# Patient Record
Sex: Female | Born: 1990
Health system: Southern US, Community
[De-identification: ages and names within clinical notes are randomized; demographics above are authoritative.]

## PROBLEM LIST (undated history)

## (undated) ENCOUNTER — Inpatient Hospital Stay (HOSPITAL_COMMUNITY): Payer: Self-pay

## (undated) DIAGNOSIS — E669 Obesity, unspecified: Secondary | ICD-10-CM

## (undated) DIAGNOSIS — Z8759 Personal history of other complications of pregnancy, childbirth and the puerperium: Secondary | ICD-10-CM

## (undated) DIAGNOSIS — K5909 Other constipation: Secondary | ICD-10-CM

## (undated) DIAGNOSIS — I10 Essential (primary) hypertension: Secondary | ICD-10-CM

## (undated) DIAGNOSIS — E282 Polycystic ovarian syndrome: Secondary | ICD-10-CM

## (undated) HISTORY — PX: OTHER SURGICAL HISTORY: SHX169

## (undated) HISTORY — PX: TONSILLECTOMY: SUR1361

## (undated) HISTORY — DX: Other constipation: K59.09

## (undated) HISTORY — DX: Personal history of other complications of pregnancy, childbirth and the puerperium: Z87.59

---
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
## 2009-01-13 ENCOUNTER — Emergency Department (HOSPITAL_COMMUNITY): Admission: EM | Admit: 2009-01-13 | Discharge: 2009-01-14 | Payer: Self-pay | Admitting: Emergency Medicine

## 2010-09-02 NOTE — Op Note (Signed)
[place]. [facility]  Patient:    Colleen Bradley, Colleen Bradley                         MRN: [id number] Proc. Date: 06/14/00 Adm. Date:  [id number] Attending:  Colbert Ewing                           Operative Report  PREOPERATIVE DIAGNOSIS:  Status post open reduction and internal fixation proximal humerus fracture, left, with painful retained hardware.  POSTOPERATIVE DIAGNOSIS:  Status post open reduction and internal fixation proximal humerus fracture, left, with painful retained hardware.  PROCEDURE:  Removal of threaded Steinmann pin, left proximal humerus.  SURGEON:  Loreta Ave, M.D.  ANESTHESIA:  General.  ESTIMATED BLOOD LOSS:  Minimal.  SPECIMENS:  None.  CULTURES:  None.  COMPLICATIONS:  None.  DRESSINGS:  Soft compressive.  DESCRIPTION OF PROCEDURE:  Patient brought to the operating room and after adequate anesthesia had been obtained, left upper arm prepped and draped in the usual sterile fashion.  Previous longitudinal incision utilized for insertion of the pin was opened up.  I could then bring the base of the pin out into this incision.  There was a surrounding bursa with about 10 cc of clear fluid that was drained and evacuated.  The pin was then captured and backed out of the humerus without difficulty.  The wound was then thoroughly irrigated with sterile saline.  Closed with interrupted nylon.  Margins of the wound injected with Marcaine.  A sterile compressive dressing applied. Anesthesia reversed, brought to the recovery room.  Tolerated the surgery well, no complications. DD:  06/14/00 TD:  06/14/00 Job: [id number]

## 2010-09-02 NOTE — Op Note (Signed)
[place]. [facility]  Patient:    Colleen Bradley, Colleen Bradley                         MRN: [id number] Proc. Date: 04/20/00 Adm. Date:  [id number] Attending:  Colbert Ewing                           Operative Report  PREOPERATIVE DIAGNOSIS:  Displaced Salter fracture of proximal humerus, left shoulder.  POSTOPERATIVE DIAGNOSIS:  Displaced Salter fracture of proximal humerus, left shoulder.  PROCEDURE:  Closed reduction and pinning, Salter fracture, proximal humerus left shoulder.  SURGEON:  Loreta Ave, M.D.  ASSISTANT:  Arlys John D. Petrarca, P.A.-C.  ANESTHESIA:  General.  ESTIMATED BLOOD LOSS:  Minimal.  SPECIMENS:  None.  CULTURES:  None.  COMPLICATIONS:  None.  DRESSING:  Soft compressive with shoulder immobilizer.  DESCRIPTION OF PROCEDURE:  Patient brought to the operating room and placed on the operating table in supine position.  After adequate anesthesia had been obtained, placed in a beach chair position on the shoulder positioner, prepped and draped in the usual sterile fashion.  With fluoroscopic guidance with the C-arm, near-anatomic reduction was achieved.  This was then fixed with a partially-threaded guidewire placed through a small stab wound in the lateral aspect of the humerus into the shaft, across the growth plate, and into the humeral head.  Care taken not to penetrate the joint.  One wire was used to stabilize the fracture, and a second one was placed in central position to obtain maximum holding.  The first guidewire was then removed, leaving one in place with good fixation.  The guidewire was cut off below the level of the skin, skin brought over the top of this, and then closed with nylon after being irrigated and injected with Marcaine.  Sterile compressive dressing applied.  Final C-arm films showed anatomic alignment with good fixation. After the immobilizer was in place, the anesthesia was reversed, brought to the  recovery room.  Tolerated the surgery well with no complications. DD:  04/20/00 TD:  04/20/00 Job: [id number]

## 2012-07-10 ENCOUNTER — Emergency Department (HOSPITAL_COMMUNITY)
Admission: EM | Admit: 2012-07-10 | Discharge: 2012-07-10 | Disposition: A | Discharge status: Home / Self Care | Payer: 59 | Attending: Emergency Medicine | Admitting: Emergency Medicine

## 2012-07-10 ENCOUNTER — Encounter (HOSPITAL_COMMUNITY): Payer: Self-pay | Admitting: Adult Health

## 2012-07-10 DIAGNOSIS — E282 Polycystic ovarian syndrome: Secondary | ICD-10-CM | POA: Insufficient documentation

## 2012-07-10 DIAGNOSIS — Z202 Contact with and (suspected) exposure to infections with a predominantly sexual mode of transmission: Secondary | ICD-10-CM | POA: Insufficient documentation

## 2012-07-10 DIAGNOSIS — A599 Trichomoniasis, unspecified: Secondary | ICD-10-CM | POA: Insufficient documentation

## 2012-07-10 DIAGNOSIS — Z2089 Contact with and (suspected) exposure to other communicable diseases: Secondary | ICD-10-CM | POA: Insufficient documentation

## 2012-07-10 DIAGNOSIS — N898 Other specified noninflammatory disorders of vagina: Secondary | ICD-10-CM | POA: Insufficient documentation

## 2012-07-10 DIAGNOSIS — A64 Unspecified sexually transmitted disease: Secondary | ICD-10-CM

## 2012-07-10 DIAGNOSIS — Z3202 Encounter for pregnancy test, result negative: Secondary | ICD-10-CM | POA: Insufficient documentation

## 2012-07-10 DIAGNOSIS — N9489 Other specified conditions associated with female genital organs and menstrual cycle: Secondary | ICD-10-CM | POA: Insufficient documentation

## 2012-07-10 HISTORY — DX: Polycystic ovarian syndrome: E28.2

## 2012-07-10 LAB — WET PREP, GENITAL: Yeast Wet Prep HPF POC: NONE SEEN

## 2012-07-10 LAB — POCT PREGNANCY, URINE: Preg Test, Ur: NEGATIVE

## 2012-07-10 MED ORDER — METRONIDAZOLE 500 MG PO TABS
2000.0000 mg | ORAL_TABLET | Freq: Once | ORAL | Status: AC
  Administered 2012-07-10: 2000 mg via ORAL
  Filled 2012-07-10: qty 4

## 2012-07-10 MED ORDER — LIDOCAINE HCL (PF) 1 % IJ SOLN
INTRAMUSCULAR | Status: AC
  Administered 2012-07-10: 2 mL
  Filled 2012-07-10: qty 5

## 2012-07-10 MED ORDER — CEFTRIAXONE SODIUM 250 MG IJ SOLR
250.0000 mg | Freq: Once | INTRAMUSCULAR | Status: AC
  Administered 2012-07-10: 250 mg via INTRAMUSCULAR
  Filled 2012-07-10: qty 250

## 2012-07-10 MED ORDER — AZITHROMYCIN 1 G PO PACK
1.0000 g | PACK | Freq: Once | ORAL | Status: AC
  Administered 2012-07-10: 1 g via ORAL
  Filled 2012-07-10: qty 1

## 2012-07-10 NOTE — ED Provider Notes (Signed)
History    This chart was scribed for non-physician practitioner Dierdre Forth, PA-C working with Joya Gaskins, MD by Gerlean Ren, ED Scribe. This patient was seen in room TR10C/TR10C and the patient's care was started at 6:20 PM.    CSN: [id number]  Arrival date & time 07/10/12  1712   First MD Initiated Contact with Patient 07/10/12 1742      Chief Complaint  Patient presents with  . Vaginal Discharge     The history is provided by the patient. No language interpreter was used.  Colleen Bradley is a 22 y.o. female who presents to the Emergency Department complaining of 4-6 weeks of clear vaginal discharge that waxes-and-wanes in amount of discharge and occasional itchiness.  Pt denies any associated vaginal pain or abdominal pain.  No recent antibiotics.  Last sexual intercourse 2 months ago, prior to any of the current symptoms.  Pt reports that her last sexual partner was treated for trichomonas prior to last sexual intercourse 2 months ago.  She has never been treated for trichomonas. No pain or blood during intercourse.  No birth control.  LNMP 02/25, pt states she feels that her menstrual cycle is about to begin.  Pt has PCOS.  No current regular medications.  Past Medical History  Diagnosis Date  . PCOS (polycystic ovarian syndrome)     History reviewed. No pertinent past surgical history.  History reviewed. No pertinent family history.  History  Substance Use Topics  . Smoking status: Not on file  . Smokeless tobacco: Not on file  . Alcohol Use: Not on file    No OB history provided.   Review of Systems  Constitutional: Negative for fever, diaphoresis, appetite change, fatigue and unexpected weight change.  HENT: Negative for mouth sores and neck stiffness.   Eyes: Negative for visual disturbance.  Respiratory: Negative for cough, chest tightness, shortness of breath and wheezing.   Cardiovascular: Negative for chest pain.  Gastrointestinal: Negative for  nausea, vomiting, abdominal pain, diarrhea and constipation.  Endocrine: Negative for polydipsia, polyphagia and polyuria.  Genitourinary: Positive for vaginal discharge. Negative for dysuria, urgency, frequency, hematuria, vaginal bleeding, vaginal pain and pelvic pain.       Vaginal itching  Musculoskeletal: Negative for back pain.  Skin: Negative for rash.  Allergic/Immunologic: Negative for immunocompromised state.  Neurological: Negative for syncope, light-headedness and headaches.  Hematological: Does not bruise/bleed easily.  Psychiatric/Behavioral: Negative for sleep disturbance. The patient is not nervous/anxious.     Allergies  Penicillins  Home Medications  No current outpatient prescriptions on file.  BP 157/99  Pulse 96  Temp(Src) 98.4 F (36.9 C) (Oral)  Resp 16  SpO2 100%  LMP 06/14/2012  Physical Exam  Nursing note and vitals reviewed. Constitutional: She is oriented to person, place, and time. She appears well-developed and well-nourished. No distress.  HENT:  Head: Normocephalic and atraumatic.  Mouth/Throat: Oropharynx is clear and moist. No oropharyngeal exudate.  Eyes: Conjunctivae and EOM are normal. Pupils are equal, round, and reactive to light. No scleral icterus.  Neck: Normal range of motion. Neck supple.  Cardiovascular: Normal rate, regular rhythm, normal heart sounds and intact distal pulses.  Exam reveals no gallop and no friction rub.   No murmur heard. Pulmonary/Chest: Effort normal and breath sounds normal. No respiratory distress. She has no wheezes.  Abdominal: Soft. Bowel sounds are normal. She exhibits no mass. There is no tenderness. There is no rebound and no guarding. Hernia confirmed negative in the right  inguinal area and confirmed negative in the left inguinal area.  Genitourinary: No labial fusion. There is no rash, tenderness, lesion or injury on the right labia. There is no rash, tenderness, lesion or injury on the left labia.  Uterus is not deviated, not enlarged, not fixed and not tender. Cervix exhibits discharge (thick milky white). Cervix exhibits no motion tenderness and no friability. Right adnexum displays no mass, no tenderness and no fullness. Left adnexum displays no mass, no tenderness and no fullness. No erythema, tenderness or bleeding around the vagina. No foreign body around the vagina. No signs of injury around the vagina. Vaginal discharge (thick, moderate amount, milky white) found.  Musculoskeletal: Normal range of motion. She exhibits no edema.  Lymphadenopathy:    She has no cervical adenopathy.       Right: No inguinal adenopathy present.       Left: No inguinal adenopathy present.  Neurological: She is alert and oriented to person, place, and time. She exhibits normal muscle tone. Coordination normal.  Speech is clear and goal oriented Moves extremities without ataxia  Skin: Skin is warm and dry. No rash noted. She is not diaphoretic. No erythema.  Psychiatric: She has a normal mood and affect.    ED Course  Procedures (including critical care time) DIAGNOSTIC STUDIES: Oxygen Saturation is 100% on room air, normal by my interpretation.    COORDINATION OF CARE: 6:24 PM- Patient informed of clinical course including pelvic exam, understands medical decision-making process, and agrees with plan.  Informed pt that her last sexual partner may need to be treated for trichomonas again due to intercourse after last treatment.  Results for orders placed during the hospital encounter of 07/10/12  WET PREP, GENITAL      Result Value Range   Yeast Wet Prep HPF POC NONE SEEN  NONE SEEN   Trich, Wet Prep MODERATE (*) NONE SEEN   Clue Cells Wet Prep HPF POC NONE SEEN  NONE SEEN   WBC, Wet Prep HPF POC TOO NUMEROUS TO COUNT (*) NONE SEEN  POCT PREGNANCY, URINE      Result Value Range   Preg Test, Ur NEGATIVE  NEGATIVE       1. Trichomonas   2. Trichomonas contact   3. STD (female)   4. Vaginal  discharge   5. POLYCYSTIC OVARY       MDM  SEINI LANNOM presents with exposure to any concerns for STDs associated with vaginal discharge.  Patient to be discharged with instructions to follow up with OBGYN. Discussed importance of using protection when sexually active. Pt understands that they have GC/Chlamydia cultures pending and that they will need to inform all sexual partners if results return positive. Pt has been treated prophylacticly with azithromycin and rocephin due to pts history, pelvic exam, and wet prep with increased WBCs. Patient also with trichomonas on wet prep and was treated here in the department with Flagyl. Pt not concerning for PID because hemodynamically stable and no cervical motion tenderness on pelvic exam. Without evidence of BV or yeast. I have also discussed reasons to return immediately to the ER.  Patient expresses understanding and agrees with plan.  Patient with an allergy to penicillin therefore she was observed for greater than 30 minutes after Rocephin IM. Patient had no adverse reactions and did not develop shortness of breath, wheezing, hives or other complication.   I personally performed the services described in this documentation, which was scribed in my presence. The recorded information  has been reviewed and is accurate.       Dahlia Client Marlyss Cissell, PA-C 07/10/12 2036

## 2012-07-10 NOTE — ED Notes (Signed)
Presents with clear/white vaginal discharge and itchiness that began one month ago. Recent sexual contact with a person with Trichomonas. Denies abdominal pain.

## 2012-07-11 LAB — GC/CHLAMYDIA PROBE AMP
CT Probe RNA: NEGATIVE
GC Probe RNA: NEGATIVE

## 2012-07-11 NOTE — ED Provider Notes (Signed)
Medical screening examination/treatment/procedure(s) were performed by non-physician practitioner and as supervising physician I was immediately available for consultation/collaboration.   Joya Gaskins, MD 07/11/12 [phone]

## 2012-09-08 ENCOUNTER — Encounter (HOSPITAL_COMMUNITY): Payer: Self-pay | Admitting: Nurse Practitioner

## 2012-09-08 ENCOUNTER — Emergency Department (HOSPITAL_COMMUNITY)
Admission: EM | Admit: 2012-09-08 | Discharge: 2012-09-08 | Disposition: A | Discharge status: Home / Self Care | Payer: 59 | Attending: Emergency Medicine | Admitting: Emergency Medicine

## 2012-09-08 DIAGNOSIS — F17200 Nicotine dependence, unspecified, uncomplicated: Secondary | ICD-10-CM | POA: Insufficient documentation

## 2012-09-08 DIAGNOSIS — E282 Polycystic ovarian syndrome: Secondary | ICD-10-CM | POA: Insufficient documentation

## 2012-09-08 DIAGNOSIS — Z880 Allergy status to penicillin: Secondary | ICD-10-CM | POA: Insufficient documentation

## 2012-09-08 DIAGNOSIS — N898 Other specified noninflammatory disorders of vagina: Secondary | ICD-10-CM | POA: Insufficient documentation

## 2012-09-08 DIAGNOSIS — R102 Pelvic and perineal pain: Secondary | ICD-10-CM

## 2012-09-08 DIAGNOSIS — Z3202 Encounter for pregnancy test, result negative: Secondary | ICD-10-CM | POA: Insufficient documentation

## 2012-09-08 DIAGNOSIS — Z8619 Personal history of other infectious and parasitic diseases: Secondary | ICD-10-CM | POA: Insufficient documentation

## 2012-09-08 DIAGNOSIS — E669 Obesity, unspecified: Secondary | ICD-10-CM | POA: Insufficient documentation

## 2012-09-08 DIAGNOSIS — N949 Unspecified condition associated with female genital organs and menstrual cycle: Secondary | ICD-10-CM | POA: Insufficient documentation

## 2012-09-08 LAB — URINE MICROSCOPIC-ADD ON

## 2012-09-08 LAB — URINALYSIS, ROUTINE W REFLEX MICROSCOPIC
Bilirubin Urine: NEGATIVE
Ketones, ur: NEGATIVE mg/dL
Nitrite: NEGATIVE
Specific Gravity, Urine: 1.023 (ref 1.005–1.030)
pH: 7 (ref 5.0–8.0)

## 2012-09-08 LAB — COMPREHENSIVE METABOLIC PANEL
ALT: 9 U/L (ref 0–35)
AST: 12 U/L (ref 0–37)
Alkaline Phosphatase: 55 U/L (ref 39–117)
CO2: 23 mEq/L (ref 19–32)
GFR calc Af Amer: >90 mL/min (ref >90)
GFR calc non Af Amer: >90 mL/min (ref >90)
Glucose, Bld: 95 mg/dL (ref 70–99)
Potassium: 3.7 mEq/L (ref 3.5–5.1)
Sodium: 137 mEq/L (ref 135–145)
Total Protein: 7.3 g/dL (ref 6.0–8.3)

## 2012-09-08 LAB — CBC WITH DIFFERENTIAL/PLATELET
Basophils Absolute: 0 10*3/uL (ref 0.0–0.1)
Lymphocytes Relative: 46 % (ref 12–46)
Lymphs Abs: 3.6 10*3/uL (ref 0.7–4.0)
Neutrophils Relative %: 45 % (ref 43–77)
Platelets: 329 10*3/uL (ref 150–400)
RBC: 4.61 MIL/uL (ref 3.87–5.11)
RDW: 12.6 % (ref 11.5–15.5)
WBC: 8 10*3/uL (ref 4.0–10.5)

## 2012-09-08 LAB — WET PREP, GENITAL
Trich, Wet Prep: NONE SEEN
Yeast Wet Prep HPF POC: NONE SEEN

## 2012-09-08 MED ORDER — LIDOCAINE HCL (PF) 1 % IJ SOLN
INTRAMUSCULAR | Status: AC
  Filled 2012-09-08: qty 5

## 2012-09-08 MED ORDER — CEFTRIAXONE SODIUM 1 G IJ SOLR
1.0000 g | Freq: Once | INTRAMUSCULAR | Status: AC
  Administered 2012-09-08: 1 g via INTRAMUSCULAR
  Filled 2012-09-08: qty 10

## 2012-09-08 MED ORDER — LIDOCAINE HCL (PF) 1 % IJ SOLN
1.0000 mL | Freq: Once | INTRAMUSCULAR | Status: AC
  Administered 2012-09-08: 1 mL

## 2012-09-08 MED ORDER — AZITHROMYCIN 1 G PO PACK
1.0000 g | PACK | Freq: Once | ORAL | Status: AC
  Administered 2012-09-08: 1 g via ORAL
  Filled 2012-09-08: qty 1
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
## 2012-09-08 NOTE — ED Provider Notes (Signed)
History     CSN: [id number]  Arrival date & time 09/08/12  1253   First MD Initiated Contact with Patient 09/08/12 1313      Chief Complaint  Patient presents with  . Abdominal Pain    (Consider location/radiation/quality/duration/timing/severity/associated sxs/prior treatment) HPI Comments: Colleen Bradley is a 22 y/o F with PMHx of PCOS and Trichomoniasis infection presenting to the ED with pelvic pain x 2 days. Patient stated that lower pelvic pain is a constant pressure sensation with intermittent episodes of cramping that last a couple of minutes with radiation to the back as an intermittent aching sensation. Patient reported that when she was douching approximately 2 days ago she noticed an incident of green discharge that was stringy consistency - patient stated that she only saw this form of discharge once and only at this moment. Patient stated that she had one episodes of emesis 2 days ago - consisting mainly of food contents. Patient reported that she had a headache the other day - bilateral aching sensation - that has resolved. LMP 08/15/2012, patient reported being sexually active and last sexual encounter was approximate 4 days ago - reported that she does not use protection. Denied fever, chills, nausea, diarrhea, hematuria, dysuria, numbness, tingling, rash, visual distortions.   PCP none  The history is provided by the patient. No language interpreter was used.    Past Medical History  Diagnosis Date  . PCOS (polycystic ovarian syndrome)     No past surgical history on file.  No family history on file.  History  Substance Use Topics  . Smoking status: Current Every Day Smoker    Types: Cigarettes  . Smokeless tobacco: Not on file  . Alcohol Use: No    OB History   Grav Para Term Preterm Abortions TAB SAB Ect Mult Living                  Review of Systems  Constitutional: Negative for fever, chills and fatigue.  HENT: Negative for trouble swallowing, neck pain  and neck stiffness.   Eyes: Negative for photophobia, pain and visual disturbance.  Respiratory: Negative for cough, chest tightness and shortness of breath.   Gastrointestinal: Positive for vomiting. Negative for nausea, abdominal pain, diarrhea, constipation and blood in stool.  Genitourinary: Positive for vaginal discharge and pelvic pain. Negative for dysuria, frequency, decreased urine volume, vaginal bleeding, difficulty urinating and vaginal pain.  Musculoskeletal: Positive for back pain.  Skin: Negative for rash.  Neurological: Positive for headaches. Negative for dizziness, weakness, light-headedness and numbness.  All other systems reviewed and are negative.    Allergies  Penicillins  Home Medications  No current outpatient prescriptions on file.  BP 149/90  Pulse 93  Temp(Src) 98.4 F (36.9 C) (Oral)  Resp 20  SpO2 96%  LMP 08/15/2012  Physical Exam  Nursing note and vitals reviewed. Constitutional: She is oriented to person, place, and time. She appears well-developed and well-nourished. No distress.  Patient calm  Appears to be relaxed, in no acute pain or discomfort.   HENT:  Head: Normocephalic and atraumatic.  Mouth/Throat: Oropharynx is clear and moist. No oropharyngeal exudate.  Uvula midline, symmetrical elevation   Eyes: Conjunctivae and EOM are normal. Pupils are equal, round, and reactive to light. Right eye exhibits no discharge. Left eye exhibits no discharge.  Neck: Normal range of motion. Neck supple. No tracheal deviation present. No thyromegaly present.  Negative neck stiffness Negative nuchal rigidity Negative lymphadenopathy  Cardiovascular: Normal rate, regular  rhythm and normal heart sounds.  Exam reveals no friction rub.   No murmur heard. Pulses:      Radial pulses are 2+ on the right side, and 2+ on the left side.  Pulmonary/Chest: Effort normal and breath sounds normal. No respiratory distress. She has no wheezes. She has no rales.    Abdominal: Soft. Bowel sounds are normal. She exhibits no distension, no fluid wave, no ascites and no mass. There is tenderness (Pelvic pain bilaterally). There is no rigidity, no rebound and no guarding.    Obese   Genitourinary: Vaginal discharge found.  Speculum: Negative lesions, sores, erythema, swelling, inflammation noted to the external genitalia. Negative swelling, erythema, inflammation, lesions, sores noted to the vaginal canal. Positive thick white discharge noted. Non-odorous. Negative erythema, inflammation, swelling, "strawberry" appearance to the cervix.   Pelvic: Negative masses, lesions, sores palpated. Negative cervical motion tenderness. Adenexal  tenderness noted to the left side upon palpation - mild discomfort.   Lymphadenopathy:    She has no cervical adenopathy.  Neurological: She is alert and oriented to person, place, and time. No cranial nerve deficit. She exhibits normal muscle tone. Coordination normal.  Skin: Skin is warm and dry. No rash noted. She is not diaphoretic. No erythema.  Psychiatric: She has a normal mood and affect. Her behavior is normal. Thought content normal.    ED Course  Procedures (including critical care time)  Labs Reviewed  COMPREHENSIVE METABOLIC PANEL - Abnormal; Notable for the following:    Total Bilirubin 0.2 (*)    All other components within normal limits  URINALYSIS, ROUTINE W REFLEX MICROSCOPIC - Abnormal; Notable for the following:    Hgb urine dipstick SMALL (*)    Protein, ur 30 (*)    All other components within normal limits  URINE MICROSCOPIC-ADD ON - Abnormal; Notable for the following:    Squamous Epithelial / LPF FEW (*)    Bacteria, UA FEW (*)    All other components within normal limits  WET PREP, GENITAL  GC/CHLAMYDIA PROBE AMP  CBC WITH DIFFERENTIAL  POCT PREGNANCY, URINE   No results found.   1. Pelvic pain   2. Vaginal discharge   3. History of trichomoniasis   4. Polycystic ovarian syndrome        MDM  Patient afebrile, non-tachycardic, non-tachpneic, normotensive, adequate saturation on room air, alert and oriented.  Patient recently treated for Trich 06/2012  Wet prep negative findings GC/Chlamydia probe being processed  CBC negative findings CMP negative findings UA negative findings Urine pregnancy negative  Patient appears calm and collected. Negative acute abdomen and peritoneal findings. Upon pelvic exam, mild adenexal tenderness noted to the left side. Thick, white discharge noted to the pelvic exam - patient tolerated exam well. Negative infections noted on swabs. Imaging not warranted at the moment secondary to PCOS history - less likely to be ovarian torsion since patient comfortable and not experiencing discomfort during time in ED setting. Patient given Ceftriaxone 1 gram IM and Azithromycin 1 gram PO given for coverage of possible infection. Patient aseptic, non-toxic appearing, in no acute distress. Patient discharged. Referred patient to [facility] and referred patient to [facility]. Discussed with patient the need to continue to receive Pap smears at least once a year. Discussed with patient to continue to rest and stay hydrated. Educated patient on STDs and the importance of sexual protection. Discussed with patient to monitor symptoms and if symptoms are to worsen or change to report back to the  ED. Resource guide given to patient.  Patient agreed to plan of care, understood, all questions answered.        Raymon Mutton, PA-C 09/08/12 1726

## 2012-09-08 NOTE — ED Notes (Signed)
Pt to be discharged home. Has no further questions. Instructed to refrain from sexual activity until tests result.

## 2012-09-08 NOTE — ED Notes (Signed)
Pt states lower abdominal pain, green colored vaginal discharge and nausea/vomiting. Ongoing for several days. 6/10 pain at the time. LMP: 08/15/12. Pt is alert and oriented x4.

## 2012-09-09 NOTE — ED Provider Notes (Signed)
Medical screening examination/treatment/procedure(s) were performed by non-physician practitioner and as supervising physician I was immediately available for consultation/collaboration.   Loren Racer, MD 09/09/12 [phone]

## 2012-09-10 ENCOUNTER — Inpatient Hospital Stay (HOSPITAL_COMMUNITY)
Admission: AD | Admit: 2012-09-10 | Discharge: 2012-09-10 | Discharge status: Against Medical Advice | Payer: Self-pay | Source: Ambulatory Visit | Attending: Obstetrics & Gynecology | Admitting: Obstetrics & Gynecology

## 2012-09-10 ENCOUNTER — Encounter (HOSPITAL_COMMUNITY): Payer: Self-pay | Admitting: *Deleted

## 2012-09-10 LAB — GC/CHLAMYDIA PROBE AMP: CT Probe RNA: NEGATIVE

## 2012-09-10 NOTE — MAU Note (Signed)
Not in lobby #2 

## 2012-09-10 NOTE — MAU Note (Signed)
Not in lobby

## 2012-09-10 NOTE — MAU Note (Signed)
Not in lobby #3.  Not preg, ? Was to f/u in hosp CLIN[facility] per note, not MAU

## 2012-09-10 NOTE — MAU Note (Deleted)
Has been in distress, mom had surgery again- causing stress.  Has had cramping in lower abd.  Just wanted to get checked- make sure everything is ok

## 2013-03-30 ENCOUNTER — Emergency Department (HOSPITAL_COMMUNITY)
Admission: EM | Admit: 2013-03-30 | Discharge: 2013-03-31 | Disposition: A | Discharge status: Home / Self Care | Payer: 59 | Attending: Emergency Medicine | Admitting: Emergency Medicine

## 2013-03-30 ENCOUNTER — Encounter (HOSPITAL_COMMUNITY): Payer: Self-pay | Admitting: Emergency Medicine

## 2013-03-30 DIAGNOSIS — S139XXA Sprain of joints and ligaments of unspecified parts of neck, initial encounter: Secondary | ICD-10-CM | POA: Insufficient documentation

## 2013-03-30 DIAGNOSIS — S339XXA Sprain of unspecified parts of lumbar spine and pelvis, initial encounter: Secondary | ICD-10-CM | POA: Insufficient documentation

## 2013-03-30 DIAGNOSIS — Z8742 Personal history of other diseases of the female genital tract: Secondary | ICD-10-CM | POA: Insufficient documentation

## 2013-03-30 DIAGNOSIS — S39012A Strain of muscle, fascia and tendon of lower back, initial encounter: Secondary | ICD-10-CM

## 2013-03-30 DIAGNOSIS — Y9389 Activity, other specified: Secondary | ICD-10-CM | POA: Insufficient documentation

## 2013-03-30 DIAGNOSIS — Y92410 Unspecified street and highway as the place of occurrence of the external cause: Secondary | ICD-10-CM | POA: Insufficient documentation

## 2013-03-30 DIAGNOSIS — Z880 Allergy status to penicillin: Secondary | ICD-10-CM | POA: Insufficient documentation

## 2013-03-30 DIAGNOSIS — S161XXA Strain of muscle, fascia and tendon at neck level, initial encounter: Secondary | ICD-10-CM

## 2013-03-30 DIAGNOSIS — S0990XA Unspecified injury of head, initial encounter: Secondary | ICD-10-CM | POA: Insufficient documentation

## 2013-03-30 DIAGNOSIS — F17200 Nicotine dependence, unspecified, uncomplicated: Secondary | ICD-10-CM | POA: Insufficient documentation

## 2013-03-30 MED ORDER — CYCLOBENZAPRINE HCL 10 MG PO TABS
5.0000 mg | ORAL_TABLET | Freq: Once | ORAL | Status: AC
  Administered 2013-03-31: 5 mg via ORAL
  Filled 2013-03-30: qty 1

## 2013-03-30 MED ORDER — KETOROLAC TROMETHAMINE 30 MG/ML IJ SOLN
30.0000 mg | Freq: Once | INTRAMUSCULAR | Status: AC
  Administered 2013-03-31: 30 mg via INTRAMUSCULAR
  Filled 2013-03-30: qty 1
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
## 2013-03-31 MED ORDER — CYCLOBENZAPRINE HCL 5 MG PO TABS: 5.0000 mg | ORAL_TABLET | Freq: Three times a day (TID) | ORAL | Status: DC | PRN

## 2013-03-31 MED ORDER — NAPROXEN 375 MG PO TABS: 375.0000 mg | ORAL_TABLET | Freq: Two times a day (BID) | ORAL | Status: DC

## 2013-03-31 NOTE — ED Provider Notes (Signed)
Medical screening examination/treatment/procedure(s) were performed by non-physician practitioner and as supervising physician I was immediately available for consultation/collaboration.  EKG Interpretation   None         Oberon Hehir, MD 03/31/13 0505 

## 2013-06-05 ENCOUNTER — Encounter (HOSPITAL_COMMUNITY): Payer: Self-pay | Admitting: Emergency Medicine

## 2013-06-05 DIAGNOSIS — Z3202 Encounter for pregnancy test, result negative: Secondary | ICD-10-CM | POA: Insufficient documentation

## 2013-06-05 DIAGNOSIS — E669 Obesity, unspecified: Secondary | ICD-10-CM | POA: Insufficient documentation

## 2013-06-05 DIAGNOSIS — Z880 Allergy status to penicillin: Secondary | ICD-10-CM | POA: Insufficient documentation

## 2013-06-05 DIAGNOSIS — I10 Essential (primary) hypertension: Secondary | ICD-10-CM | POA: Insufficient documentation

## 2013-06-05 DIAGNOSIS — F17200 Nicotine dependence, unspecified, uncomplicated: Secondary | ICD-10-CM | POA: Insufficient documentation

## 2013-06-05 DIAGNOSIS — A5901 Trichomonal vulvovaginitis: Secondary | ICD-10-CM | POA: Insufficient documentation

## 2013-06-05 DIAGNOSIS — A64 Unspecified sexually transmitted disease: Secondary | ICD-10-CM | POA: Insufficient documentation

## 2013-06-05 LAB — URINALYSIS, ROUTINE W REFLEX MICROSCOPIC
Bilirubin Urine: NEGATIVE
Glucose, UA: NEGATIVE mg/dL
KETONES UR: NEGATIVE mg/dL
NITRITE: NEGATIVE
PROTEIN: 30 mg/dL — AB
Specific Gravity, Urine: 1.026 (ref 1.005–1.030)
UROBILINOGEN UA: 1 mg/dL (ref 0.0–1.0)
pH: 7 (ref 5.0–8.0)

## 2013-06-05 LAB — URINE MICROSCOPIC-ADD ON

## 2013-06-05 LAB — POC URINE PREG, ED: Preg Test, Ur: NEGATIVE

## 2013-06-05 NOTE — ED Notes (Signed)
Pt. reports malodorous vaginal discharge x 1 month with occasional mild lower abdominal cramping . Denies dysuria or fever .

## 2013-06-06 ENCOUNTER — Emergency Department (HOSPITAL_COMMUNITY)
Admission: EM | Admit: 2013-06-06 | Discharge: 2013-06-06 | Disposition: A | Discharge status: Home / Self Care | Payer: No Typology Code available for payment source | Attending: Emergency Medicine | Admitting: Emergency Medicine

## 2013-06-06 DIAGNOSIS — A64 Unspecified sexually transmitted disease: Secondary | ICD-10-CM

## 2013-06-06 DIAGNOSIS — A599 Trichomoniasis, unspecified: Secondary | ICD-10-CM

## 2013-06-06 HISTORY — DX: Essential (primary) hypertension: I10

## 2013-06-06 HISTORY — DX: Obesity, unspecified: E66.9

## 2013-06-06 LAB — GC/CHLAMYDIA PROBE AMP
CT Probe RNA: NEGATIVE
GC Probe RNA: NEGATIVE

## 2013-06-06 LAB — WET PREP, GENITAL: Yeast Wet Prep HPF POC: NONE SEEN

## 2013-06-06 MED ORDER — METRONIDAZOLE 500 MG PO TABS: 500.0000 mg | ORAL_TABLET | Freq: Two times a day (BID) | ORAL | Status: DC

## 2013-06-06 MED ORDER — AZITHROMYCIN 250 MG PO TABS
1000.0000 mg | ORAL_TABLET | Freq: Once | ORAL | Status: AC
Start: 2013-06-06 — End: 2013-06-06
  Administered 2013-06-06: 1000 mg via ORAL
  Filled 2013-06-06: qty 4

## 2013-06-06 MED ORDER — METRONIDAZOLE 500 MG PO TABS
2000.0000 mg | ORAL_TABLET | Freq: Once | ORAL | Status: AC
  Administered 2013-06-06: 2000 mg via ORAL
  Filled 2013-06-06: qty 4

## 2013-06-06 MED ORDER — CEFTRIAXONE SODIUM 250 MG IJ SOLR
250.0000 mg | Freq: Once | INTRAMUSCULAR | Status: AC
  Administered 2013-06-06: 250 mg via INTRAMUSCULAR
  Filled 2013-06-06: qty 250

## 2013-06-06 NOTE — ED Provider Notes (Signed)
CSN: [id number]     Arrival date & time 06/05/13  2139 History   First MD Initiated Contact with Patient 06/06/13 0119     Chief Complaint  Patient presents with  . Vaginal Discharge   HPI  3 provided by the patient. The patient is a 23 year old female with past history of hypertension, PCO S. and obesity who presents with symptoms of continued or worsened vaginal discharge. Patient states she has had vaginal discharge which is thick, white and malodorous for the past several weeks to one month. She does report past history of trichomonas STD. She states symptoms are somewhat similar. She essentially active with one partner. In does not use condoms regularly. She denies any vaginal bleeding. Denies any menstrual change but does have irregular menstruation. Denies any significant abdominal pain. No fever, chills or sweats. No nausea or vomiting. No other aggravating or alleviating symptoms. no other associated symptoms.   Past Medical History  Diagnosis Date  . PCOS (polycystic ovarian syndrome)   . Obesity   . Hypertension    History reviewed. No pertinent past surgical history. No family history on file. History  Substance Use Topics  . Smoking status: Current Every Day Smoker    Types: Cigarettes  . Smokeless tobacco: Not on file  . Alcohol Use: No   OB History   Grav Para Term Preterm Abortions TAB SAB Ect Mult Living   1              Review of Systems  Constitutional: Negative for fever, chills and diaphoresis.  Gastrointestinal: Negative for nausea, vomiting, abdominal pain and diarrhea.  Genitourinary: Positive for vaginal discharge. Negative for dysuria, frequency, hematuria, flank pain and vaginal bleeding.  All other systems reviewed and are negative.      Allergies  Penicillins  Home Medications  No current outpatient prescriptions on file. BP 139/79  Pulse 98  Temp(Src) 97.9 F (36.6 C) (Oral)  Resp 14  Ht 5\' 7"  (1.702 m)  Wt 288 lb (130.636 kg)  BMI  45.10 kg/m2  SpO2 100%  LMP 05/01/2013 Physical Exam  Nursing note and vitals reviewed. Constitutional: She is oriented to person, place, and time. She appears well-developed and well-nourished. No distress.  HENT:  Head: Normocephalic.  Cardiovascular: Normal rate and regular rhythm.   Pulmonary/Chest: Effort normal and breath sounds normal.  Abdominal: Soft. There is no tenderness. There is no rebound and no guarding.  Obese  Genitourinary:  Chaperone was present. There was large amounts of thick white vaginal discharge. No CMT. No adnexal pain. No masses. No bleeding.  Neurological: She is alert and oriented to person, place, and time.  Skin: Skin is warm and dry. No rash noted.  Psychiatric: She has a normal mood and affect. Her behavior is normal.    ED Course  Procedures    COORDINATION OF CARE:  Nursing notes reviewed. Vital signs reviewed. Initial pt interview and examination performed.   1:40 AM-patient seen and evaluated. Patient well appearing. Patient with trichomonas on UA. Discussed finding and treatment. Patient advised to inform all sexual partners. [facility] She agrees to plan.   Treatment plan initiated: Medications  cefTRIAXone (ROCEPHIN) injection 250 mg (not administered)  azithromycin (ZITHROMAX) tablet 1,000 mg (not administered)  metroNIDAZOLE (FLAGYL) tablet 2,000 mg (not administered)       MDM   Final diagnoses:  Trichomoniasis  STD (female)      Angus Sellereter S Shewanda Sharpe, PA-C 06/06/13  0151 

## 2013-06-06 NOTE — ED Provider Notes (Signed)
Medical screening examination/treatment/procedure(s) were performed by non-physician practitioner and as supervising physician I was immediately available for consultation/collaboration.    Colleen Mackielga M Briant Angelillo, MD 06/06/13 [phone]

## 2013-06-06 NOTE — Discharge Instructions (Signed)
You were found to have a STD.  Please notify all of your sexual partners so they may also be treated.  Follow up with a primary care provider or county health department.  Use condoms to prevent spread of STDs.    Sexually Transmitted Disease A sexually transmitted disease (STD) is a disease or infection that may be passed (transmitted) from person to person, usually during sexual activity. This may happen by way of saliva, semen, blood, vaginal mucus, or urine. Common STDs include:   Gonorrhea.   Chlamydia.   Syphilis.   HIV and AIDS.   Genital herpes.   Hepatitis B and C.   Trichomonas.   Human papillomavirus (HPV).   Pubic lice.   Scabies.  Mites.  Bacterial vaginosis. WHAT ARE CAUSES OF STDs? An STD may be caused by bacteria, a virus, or parasites. STDs are often transmitted during sexual activity if one person is infected. However, they may also be transmitted through nonsexual means. STDs may be transmitted after:   Sexual intercourse with an infected person.   Sharing sex toys with an infected person.   Sharing needles with an infected person or using unclean piercing or tattoo needles.  Having intimate contact with the genitals, mouth, or rectal areas of an infected person.   Exposure to infected fluids during birth. WHAT ARE THE SIGNS AND SYMPTOMS OF STDs? Different STDs have different symptoms. Some people may not have any symptoms. If symptoms are present, they may include:   Painful or bloody urination.   Pain in the pelvis, abdomen, vagina, anus, throat, or eyes.   Skin rash, itching, irritation, growths, sores (lesions), ulcerations, or warts in the genital or anal area.  Abnormal vaginal discharge with or without bad odor.   Penile discharge in men.   Fever.   Pain or bleeding during sexual intercourse.   Swollen glands in the groin area.   Yellow skin and eyes (jaundice). This is seen with hepatitis.   Swollen  testicles.  Infertility.  Sores and blisters in the mouth. HOW ARE STDs DIAGNOSED? To make a diagnosis, your health care provider may:   Take a medical history.   Perform a physical exam.   Take a sample of any discharge for examination.  Swab the throat, cervix, opening to the penis, rectum, or vagina for testing.  Test a sample of your first morning urine.   Perform blood tests.   Perform a Pap smear, if this applies.   Perform a colposcopy.   Perform a laparoscopy.  HOW ARE STDs TREATED? Treatment depends on the STD. Some STDs may be treated but not cured.   Chlamydia, gonorrhea, trichomonas, and syphilis can be cured with antibiotics.   Genital herpes, hepatitis, and HIV can be treated, but not cured, with prescribed medicines. The medicines lessen symptoms.   Genital warts from HPV can be treated with medicine or by freezing, burning (electrocautery), or surgery. Warts may come back.   HPV cannot be cured with medicine or surgery. However, abnormal areas may be removed from the cervix, vagina, or vulva.   If your diagnosis is confirmed, your recent sexual partners need treatment. This is true even if they are symptom-free or have a negative culture or evaluation. They should not have sex until their health care providers say it is OK. HOW CAN I REDUCE MY RISK OF GETTING AN STD?  Use latex condoms, dental dams, and water-soluble lubricants during sexual activity. Do not use petroleum jelly or oils.  Get vaccinated  for HPV and hepatitis. If you have not received these vaccines in the past, talk to your health care provider about whether one or both might be right for you.   Avoid risky sex practices that can break the skin.  WHAT SHOULD I DO IF I THINK I HAVE AN STD?  See your health care provider.   Inform all sexual partners. They should be tested and treated for any STDs.  Do not have sex until your health care provider says it is OK. WHEN  SHOULD I GET HELP? Seek immediate medical care if:  You develop severe abdominal pain.  You are a man and notice swelling or pain in the testicles.  You are a woman and notice swelling or pain in your vagina. Document Released: 06/24/2002 Document Revised: 01/22/2013 Document Reviewed: 10/22/2012 [facility] Patient Information 2014 [place], [place].    Trichomoniasis Trichomoniasis is an infection, caused by the Trichomonas organism, that affects both women and men. In women, the outer female genitalia and the vagina are affected. In men, the penis is mainly affected, but the prostate and other reproductive organs can also be involved. Trichomoniasis is a sexually transmitted disease (STD) and is most often passed to another person through sexual contact. The majority of people who get trichomoniasis do so from a sexual encounter and are also at risk for other STDs. CAUSES   Sexual intercourse with an infected partner.  It can be present in swimming pools or hot tubs. SYMPTOMS   Abnormal gray-green frothy vaginal discharge in women.  Vaginal itching and irritation in women.  Itching and irritation of the area outside the vagina in women.  Penile discharge with or without pain in males.  Inflammation of the urethra (urethritis), causing painful urination.  Bleeding after sexual intercourse. RELATED COMPLICATIONS  Pelvic inflammatory disease.  Infection of the uterus (endometritis).  Infertility.  Tubal (ectopic) pregnancy.  It can be associated with other STDs, including gonorrhea and chlamydia, hepatitis B, and HIV. COMPLICATIONS DURING PREGNANCY  Early (premature) delivery.  Premature rupture of the membranes (PROM).  Low birth weight. DIAGNOSIS   Visualization of Trichomonas under the microscope from the vagina discharge.  Ph of the vagina greater than 4.5, tested with a test tape.  Trich Rapid Test.  Culture of the organism, but this is not usually  needed.  It may be found on a Pap test.  Having a "strawberry cervix,"which means the cervix looks very red like a strawberry. TREATMENT   You may be given medication to fight the infection. Inform your caregiver if you could be or are pregnant. Some medications used to treat the infection should not be taken during pregnancy.  Over-the-counter medications or creams to decrease itching or irritation may be recommended.  Your sexual partner will need to be treated if infected. HOME CARE INSTRUCTIONS   Take all medication prescribed by your caregiver.  Take over-the-counter medication for itching or irritation as directed by your caregiver.  Do not have sexual intercourse while you have the infection.  Do not douche or wear tampons.  Discuss your infection with your partner, as your partner may have acquired the infection from you. Or, your partner may have been the person who transmitted the infection to you.  Have your sex partner examined and treated if necessary.  Practice safe, informed, and protected sex.  See your caregiver for other STD testing. SEEK MEDICAL CARE IF:   You still have symptoms after you finish the medication.  You have an oral  temperature above 102 F (38.9 C).  You develop belly (abdominal) pain.  You have pain when you urinate.  You have bleeding after sexual intercourse.  You develop a rash.  The medication makes you sick or makes you throw up (vomit). Document Released: 09/27/2000 Document Revised: 06/26/2011 Document Reviewed: 10/23/2008 [facility] Patient Information 2014 [place], [place].

## 2014-02-16 ENCOUNTER — Encounter (HOSPITAL_COMMUNITY): Payer: Self-pay | Admitting: Emergency Medicine

## 2014-03-23 ENCOUNTER — Encounter (HOSPITAL_COMMUNITY): Payer: Self-pay | Admitting: *Deleted

## 2014-03-23 ENCOUNTER — Inpatient Hospital Stay (HOSPITAL_COMMUNITY)
Admission: AD | Admit: 2014-03-23 | Discharge: 2014-03-23 | Disposition: A | Discharge status: Home / Self Care | Payer: Medicaid Other | Source: Intra-hospital | Attending: Family Medicine | Admitting: Family Medicine

## 2014-03-23 ENCOUNTER — Inpatient Hospital Stay (HOSPITAL_COMMUNITY): Payer: Medicaid Other

## 2014-03-23 DIAGNOSIS — O10011 Pre-existing essential hypertension complicating pregnancy, first trimester: Secondary | ICD-10-CM | POA: Insufficient documentation

## 2014-03-23 DIAGNOSIS — O99331 Smoking (tobacco) complicating pregnancy, first trimester: Secondary | ICD-10-CM | POA: Insufficient documentation

## 2014-03-23 DIAGNOSIS — B9689 Other specified bacterial agents as the cause of diseases classified elsewhere: Secondary | ICD-10-CM

## 2014-03-23 DIAGNOSIS — O23591 Infection of other part of genital tract in pregnancy, first trimester: Secondary | ICD-10-CM | POA: Diagnosis not present

## 2014-03-23 DIAGNOSIS — R109 Unspecified abdominal pain: Secondary | ICD-10-CM | POA: Diagnosis present

## 2014-03-23 DIAGNOSIS — N760 Acute vaginitis: Secondary | ICD-10-CM | POA: Insufficient documentation

## 2014-03-23 DIAGNOSIS — O26899 Other specified pregnancy related conditions, unspecified trimester: Secondary | ICD-10-CM

## 2014-03-23 DIAGNOSIS — F17210 Nicotine dependence, cigarettes, uncomplicated: Secondary | ICD-10-CM | POA: Diagnosis not present

## 2014-03-23 DIAGNOSIS — Z3A01 Less than 8 weeks gestation of pregnancy: Secondary | ICD-10-CM | POA: Diagnosis not present

## 2014-03-23 LAB — CBC WITH DIFFERENTIAL/PLATELET
Basophils Absolute: 0 K/uL (ref 0.0–0.1)
Basophils Relative: 0 % (ref 0–1)
Eosinophils Absolute: 0.1 K/uL (ref 0.0–0.7)
Eosinophils Relative: 1 % (ref 0–5)
HCT: 40.1 % (ref 36.0–46.0)
Hemoglobin: 13.7 g/dL (ref 12.0–15.0)
Lymphocytes Relative: 38 % (ref 12–46)
Lymphs Abs: 3.8 K/uL (ref 0.7–4.0)
MCH: 29.9 pg (ref 26.0–34.0)
MCHC: 34.2 g/dL (ref 30.0–36.0)
MCV: 87.6 fL (ref 78.0–100.0)
Monocytes Absolute: 0.9 K/uL (ref 0.1–1.0)
Monocytes Relative: 9 % (ref 3–12)
Neutro Abs: 5.1 K/uL (ref 1.7–7.7)
Neutrophils Relative %: 52 % (ref 43–77)
Platelets: 353 K/uL (ref 150–400)
RBC: 4.58 MIL/uL (ref 3.87–5.11)
RDW: 12.7 % (ref 11.5–15.5)
WBC: 9.8 K/uL (ref 4.0–10.5)

## 2014-03-23 LAB — URINALYSIS, ROUTINE W REFLEX MICROSCOPIC
Bilirubin Urine: NEGATIVE
Glucose, UA: NEGATIVE mg/dL
KETONES UR: NEGATIVE mg/dL
Leukocytes, UA: NEGATIVE
NITRITE: NEGATIVE
Protein, ur: NEGATIVE mg/dL
SPECIFIC GRAVITY, URINE: 1.025 (ref 1.005–1.030)
UROBILINOGEN UA: 0.2 mg/dL (ref 0.0–1.0)
pH: 6 (ref 5.0–8.0)

## 2014-03-23 LAB — ABO/RH: ABO/RH(D): B POS

## 2014-03-23 LAB — URINE MICROSCOPIC-ADD ON

## 2014-03-23 LAB — HCG, QUANTITATIVE, PREGNANCY: hCG, Beta Chain, Quant, S: 7928 m[IU]/mL — ABNORMAL HIGH

## 2014-03-23 LAB — POCT PREGNANCY, URINE: Preg Test, Ur: POSITIVE — AB

## 2014-03-23 LAB — WET PREP, GENITAL
Trich, Wet Prep: NONE SEEN
Yeast Wet Prep HPF POC: NONE SEEN

## 2014-03-23 LAB — HIV ANTIBODY (ROUTINE TESTING W REFLEX): HIV 1&2 Ab, 4th Generation: NONREACTIVE

## 2014-03-23 MED ORDER — METRONIDAZOLE 0.75 % VA GEL: 1.0000 | Freq: Two times a day (BID) | VAGINAL | Status: DC

## 2014-03-23 NOTE — MAU Provider Note (Signed)
History     CSN: [id number]  Arrival date and time: 03/23/14 1251   First Provider Initiated Contact with Patient 03/23/14 1326      Chief Complaint  Patient presents with  . Possible Pregnancy  . Abdominal Cramping   HPI [name] is a 23 y.o. G1P0 at 6877w0d by LMP who presents to MAU today with complaint of lower abdominal cramping and +HPT. The patient states LMP of 02/02/14. She states lower abdominal cramping rated at 8/10 now. She denies vaginal bleeding, UTI symptoms or fever. She states occasional mild nausea without vomiting or diarrhea. She states a small amount of white vaginal discharge without odor. She states a history of irregular periods due to PCOS, but also states that she has had regular monthly cycles x 1 year.   OB History    Gravida Para Term Preterm AB TAB SAB Ectopic Multiple Living   1 0        0      Past Medical History  Diagnosis Date  . PCOS (polycystic ovarian syndrome)   . Obesity   . Hypertension     Past Surgical History  Procedure Laterality Date  . Arm surgery Left     History reviewed. No pertinent family history.  History  Substance Use Topics  . Smoking status: Current Every Day Smoker    Types: Cigarettes  . Smokeless tobacco: Never Used  . Alcohol Use: No    Allergies:  Allergies  Allergen Reactions  . Penicillins Hives    Prescriptions prior to admission  Medication Sig Dispense Refill Last Dose  . metroNIDAZOLE (FLAGYL) 500 MG tablet Take 1 tablet (500 mg total) by mouth 2 (two) times daily. (Patient not taking: Reported on 03/23/2014) 14 tablet 0     Review of Systems  Constitutional: Negative for fever and malaise/fatigue.  Gastrointestinal: Positive for abdominal pain. Negative for nausea, vomiting, diarrhea and constipation.  Genitourinary: Negative for dysuria, urgency and frequency.       Neg - vaginal bleeding + vaginal discharge   Physical Exam   Blood pressure 151/95, pulse 81, temperature 98.3 F  (36.8 C), temperature source Oral, resp. rate 18, height 5' 6.5" (1.689 m), weight 276 lb (125.193 kg), last menstrual period 02/02/2014.  Physical Exam  Constitutional: She is oriented to person, place, and time. She appears well-developed and well-nourished. No distress.  HENT:  Head: Normocephalic.  Cardiovascular: Normal rate.   Respiratory: Effort normal.  GI: Soft. She exhibits no distension and no mass. There is no tenderness. There is no rebound and no guarding.  Genitourinary: Uterus is tender (mild). Uterus is not enlarged (exam limited by maternal body habitus). Cervix exhibits no motion tenderness, no discharge and no friability. Right adnexum displays no mass and no tenderness. Left adnexum displays no tenderness. No bleeding in the vagina. Vaginal discharge (small amount of thin, white discharge noted) found.  Neurological: She is alert and oriented to person, place, and time.  Skin: Skin is warm and dry. No erythema.   Results for orders placed or performed during the hospital encounter of 03/23/14 (from the past 24 hour(s))  Urinalysis, Routine w reflex microscopic     Status: Abnormal   Collection Time: 03/23/14  1:10 PM  Result Value Ref Range   Color, Urine YELLOW YELLOW   APPearance CLEAR CLEAR   Specific Gravity, Urine 1.025 1.005 - 1.030   pH 6.0 5.0 - 8.0   Glucose, UA NEGATIVE NEGATIVE mg/dL   Hgb urine  dipstick MODERATE (A) NEGATIVE   Bilirubin Urine NEGATIVE NEGATIVE   Ketones, ur NEGATIVE NEGATIVE mg/dL   Protein, ur NEGATIVE NEGATIVE mg/dL   Urobilinogen, UA 0.2 0.0 - 1.0 mg/dL   Nitrite NEGATIVE NEGATIVE   Leukocytes, UA NEGATIVE NEGATIVE  Urine microscopic-add on     Status: Abnormal   Collection Time: 03/23/14  1:10 PM  Result Value Ref Range   Squamous Epithelial / LPF FEW (A) RARE   WBC, UA 0-2 <3 WBC/hpf   RBC / HPF 0-2 <3 RBC/hpf  Pregnancy, urine POC     Status: Abnormal   Collection Time: 03/23/14  1:19 PM  Result Value Ref Range   Preg  Test, Ur POSITIVE (A) NEGATIVE  Wet prep, genital     Status: Abnormal   Collection Time: 03/23/14  1:58 PM  Result Value Ref Range   Yeast Wet Prep HPF POC NONE SEEN NONE SEEN   Trich, Wet Prep NONE SEEN NONE SEEN   Clue Cells Wet Prep HPF POC MANY (A) NONE SEEN   WBC, Wet Prep HPF POC MANY (A) NONE SEEN  hCG, quantitative, pregnancy     Status: Abnormal   Collection Time: 03/23/14  2:10 PM  Result Value Ref Range   hCG, Beta Chain, Quant, S 7928 (H) <5 mIU/mL  CBC with Differential     Status: None   Collection Time: 03/23/14  2:10 PM  Result Value Ref Range   WBC 9.8 4.0 - 10.5 K/uL   RBC 4.58 3.87 - 5.11 MIL/uL   Hemoglobin 13.7 12.0 - 15.0 g/dL   HCT 16.1 09.6 - 04.5 %   MCV 87.6 78.0 - 100.0 fL   MCH 29.9 26.0 - 34.0 pg   MCHC 34.2 30.0 - 36.0 g/dL   RDW 40.9 81.1 - 91.4 %   Platelets 353 150 - 400 K/uL   Neutrophils Relative % 52 43 - 77 %   Neutro Abs 5.1 1.7 - 7.7 K/uL   Lymphocytes Relative 38 12 - 46 %   Lymphs Abs 3.8 0.7 - 4.0 K/uL   Monocytes Relative 9 3 - 12 %   Monocytes Absolute 0.9 0.1 - 1.0 K/uL   Eosinophils Relative 1 0 - 5 %   Eosinophils Absolute 0.1 0.0 - 0.7 K/uL   Basophils Relative 0 0 - 1 %   Basophils Absolute 0.0 0.0 - 0.1 K/uL  ABO/Rh     Status: None (Preliminary result)   Collection Time: 03/23/14  2:10 PM  Result Value Ref Range   ABO/RH(D) B POS    US Ob Comp Less 14 Wks  03/23/2014   CLINICAL DATA:  Pregnant, cramping, beta HCG 7928  EXAM: OBSTETRIC <14 WK [place] AND TRANSVAGINAL OB US  TECHNIQUE: Both transabdominal and transvaginal ultrasound examinations were performed for complete evaluation of the gestation as well as the maternal uterus, adnexal regions, and pelvic cul-de-sac. Transvaginal technique was performed to assess early pregnancy.  COMPARISON:  None.  FINDINGS: Intrauterine gestational sac: Visualized/normal in shape.  Yolk sac:  Present  Embryo:  Present  Cardiac Activity: Present  Heart Rate:  88 bpm  CRL:   2.7  mm   5 w 6 d                   [place] EDC: 11/17/2014  Maternal uterus/adnexae: No subchorionic hemorrhage.  Right ovary is within normal limits, measuring 3.0 x 2.2 x 2.0 cm.  Left ovary is within normal limits, measuring 2.1 x 1.4 x  2.2 cm.  No free fluid.  IMPRESSION: Single live intrauterine gestation with estimated gestational age [redacted] weeks 6 days by crown-rump length.   Electronically Signed   By: Charline BillsSriyesh  Krishnan M.D.   On: 03/23/2014 15:41   [place] Ob Transvaginal  03/23/2014   CLINICAL DATA:  Pregnant, cramping, beta HCG 7928  EXAM: OBSTETRIC <14 WK US AND TRANSVAGINAL OB US  TECHNIQUE: Both transabdominal and transvaginal ultrasound examinations were performed for complete evaluation of the gestation as well as the maternal uterus, adnexal regions, and pelvic cul-de-sac. Transvaginal technique was performed to assess early pregnancy.  COMPARISON:  None.  FINDINGS: Intrauterine gestational sac: Visualized/normal in shape.  Yolk sac:  Present  Embryo:  Present  Cardiac Activity: Present  Heart Rate:  88 bpm  CRL:   2.7  mm   5 w 6 d                  [place] EDC: 11/17/2014  Maternal uterus/adnexae: No subchorionic hemorrhage.  Right ovary is within normal limits, measuring 3.0 x 2.2 x 2.0 cm.  Left ovary is within normal limits, measuring 2.1 x 1.4 x 2.2 cm.  No free fluid.  IMPRESSION: Single live intrauterine gestation with estimated gestational age [redacted] weeks 6 days by crown-rump length.   Electronically Signed   By: Charline BillsSriyesh  Krishnan M.D.   On: 03/23/2014 15:41    MAU Course  Procedures None  MDM +UPT UA, wet prep, GC/Chlamydia, CBC, ABO/Rh, quant hCG, HIV and US today  Assessment and Plan  A: SIUP at 5465w6d Fetal bradycardia, most likely due to early gestation Bacterial vaginosis  P: Discharge home Rx for Metrogel sent to patient's pharmacy Patient scheduled for repeat US in 1 week to confirm normal FHR and viability First trimester warning signs discussed Patient may return to MAU as needed or if her condition  were to change or worsen   Marny LowensteinJulie N Wenzel, PA-C  03/23/2014, 3:48 PM

## 2014-03-23 NOTE — Discharge Instructions (Signed)
First Trimester of Pregnancy The first trimester of pregnancy is from week 1 until the end of week 12 (months 1 through 3). During this time, your baby will begin to develop inside you. At 6-8 weeks, the eyes and face are formed, and the heartbeat can be seen on ultrasound. At the end of 12 weeks, all the baby's organs are formed. Prenatal care is all the medical care you receive before the birth of your baby. Make sure you get good prenatal care and follow all of your doctor's instructions. HOME CARE  Medicines  Take medicine only as told by your doctor. Some medicines are safe and some are not during pregnancy.  Take your prenatal vitamins as told by your doctor.  Take medicine that helps you poop (stool softener) as needed if your doctor says it is okay. Diet  Eat regular, healthy meals.  Your doctor will tell you the amount of weight gain that is right for you.  Avoid raw meat and uncooked cheese.  If you feel sick to your stomach (nauseous) or throw up (vomit):  Eat 4 or 5 small meals a day instead of 3 large meals.  Try eating a few soda crackers.  Drink liquids between meals instead of during meals.  If you have a hard time pooping (constipation):  Eat high-fiber foods like fresh vegetables, fruit, and whole grains.  Drink enough fluids to keep your pee (urine) clear or pale yellow. Activity and Exercise  Exercise only as told by your doctor. Stop exercising if you have cramps or pain in your lower belly (abdomen) or low back.  Try to avoid standing for long periods of time. Move your legs often if you must stand in one place for a long time.  Avoid heavy lifting.  Wear low-heeled shoes. Sit and stand up straight.  You can have sex unless your doctor tells you not to. Relief of Pain or Discomfort  Wear a good support bra if your breasts are sore.  Take warm water baths (sitz baths) to soothe pain or discomfort caused by hemorrhoids. Use hemorrhoid cream if your  doctor says it is okay.  Rest with your legs raised if you have leg cramps or low back pain.  Wear support hose if you have puffy, bulging veins (varicose veins) in your legs. Raise (elevate) your feet for 15 minutes, 3-4 times a day. Limit salt in your diet. Prenatal Care  Schedule your prenatal visits by the twelfth week of pregnancy.  Write down your questions. Take them to your prenatal visits.  Keep all your prenatal visits as told by your doctor. Safety  Wear your seat belt at all times when driving.  Make a list of emergency phone numbers. The list should include numbers for family, friends, the hospital, and police and fire departments. General Tips  Ask your doctor for a referral to a local prenatal class. Begin classes no later than at the start of month 6 of your pregnancy.  Ask for help if you need counseling or help with nutrition. Your doctor can give you advice or tell you where to go for help.  Do not use hot tubs, steam rooms, or saunas.  Do not douche or use tampons or scented sanitary pads.  Do not cross your legs for long periods of time.  Avoid litter boxes and soil used by cats.  Avoid all smoking, herbs, and alcohol. Avoid drugs not approved by your doctor.  Visit your dentist. At home, brush your teeth   with a soft toothbrush. Be gentle when you floss. GET HELP IF:  You are dizzy.  You have mild cramps or pressure in your lower belly.  You have a nagging pain in your belly area.  You continue to feel sick to your stomach, throw up, or have watery poop (diarrhea).  You have a bad smelling fluid coming from your vagina.  You have pain with peeing (urination).  You have increased puffiness (swelling) in your face, hands, legs, or ankles. GET HELP RIGHT AWAY IF:   You have a fever.  You are leaking fluid from your vagina.  You have spotting or bleeding from your vagina.  You have very bad belly cramping or pain.  You gain or lose weight  rapidly.  You throw up blood. It may look like coffee grounds.  You are around people who have German measles, fifth disease, or chickenpox.  You have a very bad headache.  You have shortness of breath.  You have any kind of trauma, such as from a fall or a car accident. Document Released: 09/20/2007 Document Revised: 08/18/2013 Document Reviewed: 02/11/2013 [employer] Patient Information 2015 [employer]. This information is not intended to replace advice given to you by your health care provider. Make sure you discuss any questions you have with your health care provider.  

## 2014-03-23 NOTE — MAU Note (Signed)
3 +HPT in the last 2 days.  abd cramping feels like period cramping.  Hx of PCOS

## 2014-03-24 LAB — GC/CHLAMYDIA PROBE AMP
CT PROBE, AMP APTIMA: NEGATIVE
GC PROBE AMP APTIMA: NEGATIVE

## 2014-03-30 ENCOUNTER — Telehealth: Payer: Self-pay | Admitting: Obstetrics and Gynecology

## 2014-03-30 ENCOUNTER — Ambulatory Visit (HOSPITAL_COMMUNITY)
Admission: RE | Admit: 2014-03-30 | Discharge: 2014-03-30 | Disposition: A | Discharge status: Home / Self Care | Payer: Medicaid Other | Source: Ambulatory Visit | Attending: Medical | Admitting: Medical

## 2014-03-30 DIAGNOSIS — R109 Unspecified abdominal pain: Secondary | ICD-10-CM | POA: Insufficient documentation

## 2014-03-30 DIAGNOSIS — Z3A01 Less than 8 weeks gestation of pregnancy: Secondary | ICD-10-CM | POA: Insufficient documentation

## 2014-03-30 DIAGNOSIS — O3481 Maternal care for other abnormalities of pelvic organs, first trimester: Secondary | ICD-10-CM | POA: Insufficient documentation

## 2014-03-30 DIAGNOSIS — O26899 Other specified pregnancy related conditions, unspecified trimester: Secondary | ICD-10-CM

## 2014-03-30 DIAGNOSIS — O208 Other hemorrhage in early pregnancy: Secondary | ICD-10-CM | POA: Diagnosis not present

## 2014-03-30 DIAGNOSIS — N8320 Unspecified ovarian cysts: Secondary | ICD-10-CM | POA: Insufficient documentation

## 2014-03-30 DIAGNOSIS — O9989 Other specified diseases and conditions complicating pregnancy, childbirth and the puerperium: Secondary | ICD-10-CM | POA: Diagnosis present

## 2014-03-30 NOTE — Telephone Encounter (Signed)
US results discussed. Questions answered. Proof of pregnancy provided.

## 2014-04-02 ENCOUNTER — Inpatient Hospital Stay (HOSPITAL_COMMUNITY)
Admission: AD | Admit: 2014-04-02 | Discharge: 2014-04-02 | Disposition: A | Discharge status: Home / Self Care | Payer: Medicaid Other | Source: Ambulatory Visit | Attending: Obstetrics and Gynecology | Admitting: Obstetrics and Gynecology

## 2014-04-02 ENCOUNTER — Encounter (HOSPITAL_COMMUNITY): Payer: Self-pay | Admitting: *Deleted

## 2014-04-02 DIAGNOSIS — I10 Essential (primary) hypertension: Secondary | ICD-10-CM

## 2014-04-02 DIAGNOSIS — O10011 Pre-existing essential hypertension complicating pregnancy, first trimester: Secondary | ICD-10-CM | POA: Diagnosis not present

## 2014-04-02 DIAGNOSIS — R112 Nausea with vomiting, unspecified: Secondary | ICD-10-CM

## 2014-04-02 DIAGNOSIS — O99331 Smoking (tobacco) complicating pregnancy, first trimester: Secondary | ICD-10-CM | POA: Insufficient documentation

## 2014-04-02 DIAGNOSIS — O23591 Infection of other part of genital tract in pregnancy, first trimester: Secondary | ICD-10-CM | POA: Insufficient documentation

## 2014-04-02 DIAGNOSIS — Z3A01 Less than 8 weeks gestation of pregnancy: Secondary | ICD-10-CM | POA: Insufficient documentation

## 2014-04-02 DIAGNOSIS — O210 Mild hyperemesis gravidarum: Secondary | ICD-10-CM | POA: Diagnosis present

## 2014-04-02 DIAGNOSIS — O161 Unspecified maternal hypertension, first trimester: Secondary | ICD-10-CM

## 2014-04-02 DIAGNOSIS — N760 Acute vaginitis: Secondary | ICD-10-CM | POA: Insufficient documentation

## 2014-04-02 DIAGNOSIS — B9689 Other specified bacterial agents as the cause of diseases classified elsewhere: Secondary | ICD-10-CM | POA: Diagnosis not present

## 2014-04-02 DIAGNOSIS — F17210 Nicotine dependence, cigarettes, uncomplicated: Secondary | ICD-10-CM | POA: Insufficient documentation

## 2014-04-02 LAB — URINE MICROSCOPIC-ADD ON

## 2014-04-02 LAB — URINALYSIS, ROUTINE W REFLEX MICROSCOPIC
Glucose, UA: NEGATIVE mg/dL
Leukocytes, UA: NEGATIVE
Nitrite: NEGATIVE
PH: 6 (ref 5.0–8.0)
Protein, ur: 100 mg/dL — AB
UROBILINOGEN UA: 1 mg/dL (ref 0.0–1.0)

## 2014-04-02 LAB — CBC
HCT: 41.2 % (ref 36.0–46.0)
HEMOGLOBIN: 14.8 g/dL (ref 12.0–15.0)
MCH: 30.6 pg (ref 26.0–34.0)
MCHC: 35.9 g/dL (ref 30.0–36.0)
MCV: 85.3 fL (ref 78.0–100.0)
PLATELETS: 363 10*3/uL (ref 150–400)
RBC: 4.83 MIL/uL (ref 3.87–5.11)
RDW: 12.5 % (ref 11.5–15.5)
WBC: 12.6 10*3/uL — ABNORMAL HIGH (ref 4.0–10.5)

## 2014-04-02 LAB — WET PREP, GENITAL
Trich, Wet Prep: NONE SEEN
YEAST WET PREP: NONE SEEN

## 2014-04-02 LAB — GLUCOSE, CAPILLARY: Glucose-Capillary: 88 mg/dL (ref 70–99)

## 2014-04-02 MED ORDER — LABETALOL HCL 100 MG PO TABS: 200.0000 mg | ORAL_TABLET | Freq: Two times a day (BID) | ORAL | Status: DC

## 2014-04-02 MED ORDER — LACTATED RINGERS IV SOLN
25.0000 mg | Freq: Once | INTRAVENOUS | Status: AC
  Administered 2014-04-02: 25 mg via INTRAVENOUS
  Filled 2014-04-02: qty 1

## 2014-04-02 MED ORDER — METOCLOPRAMIDE HCL 10 MG PO TABS: 10.0000 mg | ORAL_TABLET | Freq: Three times a day (TID) | ORAL | Status: DC | PRN

## 2014-04-02 MED ORDER — METRONIDAZOLE 500 MG PO TABS: 500.0000 mg | ORAL_TABLET | Freq: Two times a day (BID) | ORAL | Status: DC

## 2014-04-02 NOTE — MAU Note (Signed)
Pt stated she has been having N/V for the past 2 weeks. Says she cannot keep anything down. C/o shrp pain in her left side that stared to day while she was vomiting.

## 2014-04-02 NOTE — MAU Provider Note (Signed)
History     CSN: [id number]  Arrival date and time: 04/02/14 1411   None     Chief Complaint  Patient presents with  . Emesis During Pregnancy   HPI 23 y.o.G1P0 4880w2d presents with n/v x 1 month, worse over last 2-3 days. Reports 8-9 episodes of vomiting bile today, unable to tolerate PO intake. Pt reports foul smelling white vaginal d/c x 2 weeks, dx with BV but unable to get Rx ABX due to insurance. Pt now has medicaid and requests repeat Rx. Pt denies dysuria, hematuria, fever/chills, constipation/diarrhea. Reports last BM 2d ago. Pt reports bil lower abd pain onset today, worse after vomiting. Pain is dull ache, 4/10, nonradiating. Pt reports in monogamous relationship, "I want to get tested though, I haven't been tested".   Pt reports hx of PCOS, previously taking glucophage. Reports known dx of HTN, denies anti-hypertensives. Pt denies HA, blurred vision, dizziness or syncope.   RN note: Pt stated she has been having N/V for the past 2 weeks. Says she cannot keep anything down. C/o shrp pain in her left side that stared to day while she was vomiting.      Past Medical History  Diagnosis Date  . PCOS (polycystic ovarian syndrome)   . Obesity   . Hypertension     Past Surgical History  Procedure Laterality Date  . Arm surgery Left     No family history on file.  History  Substance Use Topics  . Smoking status: Current Every Day Smoker    Types: Cigarettes  . Smokeless tobacco: Never Used  . Alcohol Use: No    Allergies:  Allergies  Allergen Reactions  . Penicillins Hives    Prescriptions prior to admission  Medication Sig Dispense Refill Last Dose  . metroNIDAZOLE (METROGEL VAGINAL) 0.75 % vaginal gel Place 1 Applicatorful vaginally 2 (two) times daily. 70 g 0     Review of Systems  Constitutional: Positive for malaise/fatigue. Negative for fever, chills, weight loss and diaphoresis.  Eyes: Negative for blurred vision and pain.  Respiratory: Negative  for shortness of breath.   Cardiovascular: Negative for chest pain and palpitations.  Gastrointestinal: Positive for nausea, vomiting and abdominal pain. Negative for heartburn, diarrhea, constipation, blood in stool and melena.  Genitourinary: Negative for dysuria, urgency, frequency, hematuria and flank pain.  Neurological: Negative for weakness and headaches.   Physical Exam   Blood pressure 134/93, pulse 82, temperature 98.4 F (36.9 C), resp. rate 18, height 5' 6.5" (1.689 m), weight 267 lb (121.11 kg), last menstrual period 02/02/2014.  Physical Exam  Constitutional: She appears well-developed and well-nourished.  Morbidly obese  Cardiovascular: Normal rate and normal heart sounds.   Respiratory: Effort normal and breath sounds normal. No respiratory distress.  bil breath sounds clear and diminished in bases.  GI: Soft. Bowel sounds are normal. She exhibits no distension and no mass. There is tenderness. There is no rebound and no guarding.  bil lower abd into pelvic pain with palpation, no CVA tenderness.  Genitourinary: Vagina normal.  Moderate thin white foul smelling vaginal discharge. Cervix has reddened appearance from 11-12 o'clock position, no lesions or polyps. No CMT.  Skin: Skin is warm and dry.     MAU Course  Procedures  MDM Wet prep, G/C  Done to eval vaginal discharge.  BV per wet prep. CBC eval infection, POC glucose as pt reports hx borderline DM although result: 88 suggests normal blood sugar presently. Pt BP elevated, serial BP done over course  of stay to evaluate.  Improved from 160/102 to 140/88.   IVF and phenergan to treat dehydration and nausea.  Pt responded well and is able to tolerate PO intake.    Results for orders placed or performed during the hospital encounter of 04/02/14 (from the past 24 hour(s))  Urinalysis, Routine w reflex microscopic     Status: Abnormal   Collection Time: 04/02/14  2:48 PM  Result Value Ref Range   Color, Urine YELLOW  YELLOW   APPearance CLOUDY (A) CLEAR   Specific Gravity, Urine >1.030 (H) 1.005 - 1.030   pH 6.0 5.0 - 8.0   Glucose, UA NEGATIVE NEGATIVE mg/dL   Hgb urine dipstick MODERATE (A) NEGATIVE   Bilirubin Urine SMALL (A) NEGATIVE   Ketones, ur >80 (A) NEGATIVE mg/dL   Protein, ur 147100 (A) NEGATIVE mg/dL   Urobilinogen, UA 1.0 0.0 - 1.0 mg/dL   Nitrite NEGATIVE NEGATIVE   Leukocytes, UA NEGATIVE NEGATIVE  Urine microscopic-add on     Status: Abnormal   Collection Time: 04/02/14  2:48 PM  Result Value Ref Range   Squamous Epithelial / LPF FEW (A) RARE   WBC, UA 3-6 <3 WBC/hpf   Bacteria, UA FEW (A) RARE   Urine-Other MUCOUS PRESENT   CBC     Status: Abnormal   Collection Time: 04/02/14  6:14 PM  Result Value Ref Range   WBC 12.6 (H) 4.0 - 10.5 K/uL   RBC 4.83 3.87 - 5.11 MIL/uL   Hemoglobin 14.8 12.0 - 15.0 g/dL   HCT 82.941.2 56.236.0 - 13.046.0 %   MCV 85.3 78.0 - 100.0 fL   MCH 30.6 26.0 - 34.0 pg   MCHC 35.9 30.0 - 36.0 g/dL   RDW 86.512.5 78.411.5 - 69.615.5 %   Platelets 363 150 - 400 K/uL  Glucose, capillary     Status: None   Collection Time: 04/02/14  6:25 PM  Result Value Ref Range   Glucose-Capillary 88 70 - 99 mg/dL  Wet prep, genital     Status: Abnormal   Collection Time: 04/02/14  6:28 PM  Result Value Ref Range   Yeast Wet Prep HPF POC NONE SEEN NONE SEEN   Trich, Wet Prep NONE SEEN NONE SEEN   Clue Cells Wet Prep HPF POC MODERATE (A) NONE SEEN   WBC, Wet Prep HPF POC FEW (A) NONE SEEN     Assessment and Plan   1. Essential hypertension   2. Bacterial vaginosis   3. Non-intractable vomiting with nausea, vomiting of unspecified type    P: Discussed patient with MD Constant, recommended: Flagyl 500mg  PO BID x 7d to treat BV Vaginal rest, no intercourse x 2weeks Labetalol 200mg  BID for HTN Reglan 10mg  PO PRN nausea [facility]U in [facility] to establish care, monitor as pt is increased risk. Email sent to make appt.   Educated patient to return if experiences vaginal bleeding,  intractable n/v, or increased pain.   Micker Samios, FNP-Student

## 2014-04-02 NOTE — Discharge Instructions (Signed)
Morning Sickness Morning sickness is when you feel sick to your stomach (nauseous) during pregnancy. This nauseous feeling may or may not come with vomiting. It often occurs in the morning but can be a problem any time of day. Morning sickness is most common during the first trimester, but it may continue throughout pregnancy. While morning sickness is unpleasant, it is usually harmless unless you develop severe and continual vomiting (hyperemesis gravidarum). This condition requires more intense treatment.  CAUSES  The cause of morning sickness is not completely known but seems to be related to normal hormonal changes that occur in pregnancy. RISK FACTORS You are at greater risk if you:  Experienced nausea or vomiting before your pregnancy.  Had morning sickness during a previous pregnancy.  Are pregnant with more than one baby, such as twins. TREATMENT  Do not use any medicines (prescription, over-the-counter, or herbal) for morning sickness without first talking to your health care provider. Your health care provider may prescribe or recommend:  Vitamin B6 supplements.  Anti-nausea medicines.  The herbal medicine ginger. HOME CARE INSTRUCTIONS   Only take over-the-counter or prescription medicines as directed by your health care provider.  Taking multivitamins before getting pregnant can prevent or decrease the severity of morning sickness in most women.  Eat a piece of dry toast or unsalted crackers before getting out of bed in the morning.  Eat five or six small meals a day.  Eat dry and bland foods (rice, baked potato). Foods high in carbohydrates are often helpful.  Do not drink liquids with your meals. Drink liquids between meals.  Avoid greasy, fatty, and spicy foods.  Get someone to cook for you if the smell of any food causes nausea and vomiting.  If you feel nauseous after taking prenatal vitamins, take the vitamins at night or with a snack.  Snack on protein  foods (nuts, yogurt, cheese) between meals if you are hungry.  Eat unsweetened gelatins for desserts.  Wearing an acupressure wristband (worn for sea sickness) may be helpful.  Acupuncture may be helpful.  Do not smoke.  Get a humidifier to keep the air in your house free of odors.  Get plenty of fresh air. SEEK MEDICAL CARE IF:   Your home remedies are not working, and you need medicine.  You feel dizzy or lightheaded.  You are losing weight. SEEK IMMEDIATE MEDICAL CARE IF:   You have persistent and uncontrolled nausea and vomiting.  You pass out (faint). MAKE SURE YOU:  Understand these instructions.  Will watch your condition.  Will get help right away if you are not doing well or get worse. Document Released: 05/25/2006 Document Revised: 04/08/2013 Document Reviewed: 09/18/2012 [facility]Exit[facility] Patient Information 2015 [place], [place]. This information is not intended to replace advice given to you by your health care provider. Make sure you discuss any questions you have with your health care provider. Bacterial Vaginosis Bacterial vaginosis is a vaginal infection that occurs when the normal balance of bacteria in the vagina is disrupted. It results from an overgrowth of certain bacteria. This is the most common vaginal infection in women of childbearing age. Treatment is important to prevent complications, especially in pregnant women, as it can cause a premature delivery. CAUSES  Bacterial vaginosis is caused by an increase in harmful bacteria that are normally present in smaller amounts in the vagina. Several different kinds of bacteria can cause bacterial vaginosis. However, the reason that the condition develops is not fully understood. RISK FACTORS Certain activities or  behaviors can put you at an increased risk of developing bacterial vaginosis, including:  Having a new sex partner or multiple sex partners.  Douching.  Using an intrauterine device (IUD) for  contraception. Women do not get bacterial vaginosis from toilet seats, bedding, swimming pools, or contact with objects around them. SIGNS AND SYMPTOMS  Some women with bacterial vaginosis have no signs or symptoms. Common symptoms include:  Grey vaginal discharge.  A fishlike odor with discharge, especially after sexual intercourse.  Itching or burning of the vagina and vulva.  Burning or pain with urination. DIAGNOSIS  Your health care provider will take a medical history and examine the vagina for signs of bacterial vaginosis. A sample of vaginal fluid may be taken. Your health care provider will look at this sample under a microscope to check for bacteria and abnormal cells. A vaginal pH test may also be done.  TREATMENT  Bacterial vaginosis may be treated with antibiotic medicines. These may be given in the form of a pill or a vaginal cream. A second round of antibiotics may be prescribed if the condition comes back after treatment.  HOME CARE INSTRUCTIONS   Only take over-the-counter or prescription medicines as directed by your health care provider.  If antibiotic medicine was prescribed, take it as directed. Make sure you finish it even if you start to feel better.  Do not have sex until treatment is completed.  Tell all sexual partners that you have a vaginal infection. They should see their health care provider and be treated if they have problems, such as a mild rash or itching.  Practice safe sex by using condoms and only having one sex partner. SEEK MEDICAL CARE IF:   Your symptoms are not improving after 3 days of treatment.  You have increased discharge or pain.  You have a fever. MAKE SURE YOU:   Understand these instructions.  Will watch your condition.  Will get help right away if you are not doing well or get worse. FOR MORE INFORMATION  Centers for Disease Control and Prevention, Division of STD Prevention: SolutionApps.co.zawww.cdc.gov/std American Sexual Health  Association (ASHA): www.ashastd.org  Document Released: 04/03/2005 Document Revised: 01/22/2013 Document Reviewed: 11/13/2012 [facility] Patient Information 2015 [place], [place]. This information is not intended to replace advice given to you by your health care provider. Make sure you discuss any questions you have with your health care provider. Hypertension During Pregnancy Hypertension, or high blood pressure, is when there is extra pressure inside your blood vessels that carry blood from the heart to the rest of your body (arteries). It can happen at any time in life, including pregnancy. Hypertension during pregnancy can cause problems for you and your baby. Your baby might not weigh as much as he or she should at birth or might be born early (premature). Very bad cases of hypertension during pregnancy can be life-threatening.  Different types of hypertension can occur during pregnancy. These include:  Chronic hypertension. This happens when a woman has hypertension before pregnancy and it continues during pregnancy.  Gestational hypertension. This is when hypertension develops during pregnancy.  Preeclampsia or toxemia of pregnancy. This is a very serious type of hypertension that develops only during pregnancy. It affects the whole body and can be very dangerous for both mother and baby.  Gestational hypertension and preeclampsia usually go away after your baby is born. Your blood pressure will likely stabilize within 6 weeks. Women who have hypertension during pregnancy have a greater chance of developing hypertension  later in life or with future pregnancies. RISK FACTORS There are certain factors that make it more likely for you to develop hypertension during pregnancy. These include:  Having hypertension before pregnancy.  Having hypertension during a previous pregnancy.  Being overweight.  Being older than 40 years.  Being pregnant with more than one baby.  Having diabetes or  kidney problems. SIGNS AND SYMPTOMS Chronic and gestational hypertension rarely cause symptoms. Preeclampsia has symptoms, which may include:  Increased protein in your urine. Your health care provider will check for this at every prenatal visit.  Swelling of your hands and face.  Rapid weight gain.  Headaches.  Visual changes.  Being bothered by light.  Abdominal pain, especially in the upper right area.  Chest pain.  Shortness of breath.  Increased reflexes.  Seizures. These occur with a more severe form of preeclampsia, called eclampsia. DIAGNOSIS  You may be diagnosed with hypertension during a regular prenatal exam. At each prenatal visit, you may have:  Your blood pressure checked.  A urine test to check for protein in your urine. The type of hypertension you are diagnosed with depends on when you developed it. It also depends on your specific blood pressure reading.  Developing hypertension before 20 weeks of pregnancy is consistent with chronic hypertension.  Developing hypertension after 20 weeks of pregnancy is consistent with gestational hypertension.  Hypertension with increased urinary protein is diagnosed as preeclampsia.  Blood pressure measurements that stay above 160 systolic or 110 diastolic are a sign of severe preeclampsia. TREATMENT Treatment for hypertension during pregnancy varies. Treatment depends on the type of hypertension and how serious it is.  If you take medicine for chronic hypertension, you may need to switch medicines.  Medicines called ACE inhibitors should not be taken during pregnancy.  Low-dose aspirin may be suggested for women who have risk factors for preeclampsia.  If you have gestational hypertension, you may need to take a blood pressure medicine that is safe during pregnancy. Your health care provider will recommend the correct medicine.  If you have severe preeclampsia, you may need to be in the hospital. Health care  providers will watch you and your baby very closely. You also may need to take medicine called magnesium sulfate to prevent seizures and lower blood pressure.  Sometimes, an early delivery is needed. This may be the case if the condition worsens. It would be done to protect you and your baby. The only cure for preeclampsia is delivery.  Your health care provider may recommend that you take one low-dose aspirin (81 mg) each day to help prevent high blood pressure during your pregnancy if you are at risk for preeclampsia. You may be at risk for preeclampsia if:  You had preeclampsia or eclampsia during a previous pregnancy.  Your baby did not grow as expected during a previous pregnancy.  You experienced preterm birth with a previous pregnancy.  You experienced a separation of the placenta from the uterus (placental abruption) during a previous pregnancy.  You experienced the loss of your baby during a previous pregnancy.  You are pregnant with more than one baby.  You have other medical conditions, such as diabetes or an autoimmune disease. HOME CARE INSTRUCTIONS  Schedule and keep all of your regular prenatal care appointments. This is important.  Take medicines only as directed by your health care provider. Tell your health care provider about all medicines you take.  Eat as little salt as possible.  Get regular exercise.  Do  not drink alcohol.  Do not use tobacco products.  Do not drink products with caffeine.  Lie on your left side when resting. SEEK IMMEDIATE MEDICAL CARE IF:  You have severe abdominal pain.  You have sudden swelling in your hands, ankles, or face.  You gain 4 pounds (1.8 kg) or more in 1 week.  You vomit repeatedly.  You have vaginal bleeding.  You do not feel your baby moving as much.  You have a headache.  You have blurred or double vision.  You have muscle twitching or spasms.  You have shortness of breath.  You have blue fingernails  or lips.  You have blood in your urine. MAKE SURE YOU:  Understand these instructions.  Will watch your condition.  Will get help right away if you are not doing well or get worse. Document Released: 12/20/2010 Document Revised: 08/18/2013 Document Reviewed: 10/31/2012 [facility] Patient Information 2015 [place], [place]. This information is not intended to replace advice given to you by your health care provider. Make sure you discuss any questions you have with your health care provider.

## 2014-04-03 LAB — GC/CHLAMYDIA PROBE AMP
CT PROBE, AMP APTIMA: NEGATIVE
GC Probe RNA: NEGATIVE

## 2014-04-03 LAB — HIV ANTIBODY (ROUTINE TESTING W REFLEX): HIV: NONREACTIVE

## 2014-04-07 ENCOUNTER — Encounter (HOSPITAL_COMMUNITY): Payer: Self-pay | Admitting: *Deleted

## 2014-04-07 ENCOUNTER — Inpatient Hospital Stay (HOSPITAL_COMMUNITY)
Admission: AD | Admit: 2014-04-07 | Discharge: 2014-04-07 | Disposition: A | Discharge status: Home / Self Care | Payer: Medicaid Other | Source: Ambulatory Visit | Attending: Family Medicine | Admitting: Family Medicine

## 2014-04-07 DIAGNOSIS — Z87891 Personal history of nicotine dependence: Secondary | ICD-10-CM | POA: Insufficient documentation

## 2014-04-07 DIAGNOSIS — O219 Vomiting of pregnancy, unspecified: Secondary | ICD-10-CM

## 2014-04-07 DIAGNOSIS — O210 Mild hyperemesis gravidarum: Secondary | ICD-10-CM | POA: Diagnosis present

## 2014-04-07 DIAGNOSIS — Z3A08 8 weeks gestation of pregnancy: Secondary | ICD-10-CM | POA: Diagnosis not present

## 2014-04-07 LAB — URINE MICROSCOPIC-ADD ON

## 2014-04-07 LAB — URINALYSIS, ROUTINE W REFLEX MICROSCOPIC
GLUCOSE, UA: 100 mg/dL — AB
Ketones, ur: >80 mg/dL — AB
Leukocytes, UA: NEGATIVE
Nitrite: NEGATIVE
Specific Gravity, Urine: >1.03 — ABNORMAL HIGH (ref 1.005–1.030)
Urobilinogen, UA: 4 mg/dL — ABNORMAL HIGH (ref 0.0–1.0)
pH: 6 (ref 5.0–8.0)

## 2014-04-07 LAB — GLUCOSE, CAPILLARY: GLUCOSE-CAPILLARY: 84 mg/dL (ref 70–99)

## 2014-04-07 MED ORDER — PROMETHAZINE HCL 25 MG RE SUPP: 25.0000 mg | Freq: Four times a day (QID) | RECTAL | Status: DC | PRN

## 2014-04-07 MED ORDER — M.V.I. ADULT IV INJ
Freq: Once | INTRAVENOUS | Status: AC
  Administered 2014-04-07: 16:00:00 via INTRAVENOUS
  Filled 2014-04-07: qty 1000

## 2014-04-07 MED ORDER — FAMOTIDINE IN NACL 20-0.9 MG/50ML-% IV SOLN
20.0000 mg | Freq: Once | INTRAVENOUS | Status: AC
  Administered 2014-04-07: 20 mg via INTRAVENOUS
  Filled 2014-04-07: qty 50

## 2014-04-07 MED ORDER — DEXTROSE 5 % IN LACTATED RINGERS IV BOLUS
1000.0000 mL | Freq: Once | INTRAVENOUS | Status: AC
  Administered 2014-04-07: 1000 mL via INTRAVENOUS

## 2014-04-07 MED ORDER — METOCLOPRAMIDE HCL 5 MG/ML IJ SOLN
10.0000 mg | Freq: Once | INTRAMUSCULAR | Status: AC
  Administered 2014-04-07: 10 mg via INTRAVENOUS
  Filled 2014-04-07: qty 2

## 2014-04-07 MED ORDER — LACTATED RINGERS IV SOLN
25.0000 mg | Freq: Once | INTRAVENOUS | Status: AC
  Administered 2014-04-07: 25 mg via INTRAVENOUS
  Filled 2014-04-07: qty 1

## 2014-04-07 NOTE — MAU Note (Signed)
Pt states she is not taking Labetalol because her insurance will not cover it. She did not get Flagyl po filled but used her sisters metrogel a few times

## 2014-04-07 NOTE — MAU Provider Note (Signed)
History     CSN: [id number]  Arrival date and time: 04/07/14 1036   First Provider Initiated Contact with Patient 04/07/14 1205      Chief Complaint  Patient presents with  . Emesis During Pregnancy   HPI 23 y.o.G1P0 [id number] by US presents with continued N/V and unable to tolerate PO intake x 1 week. Pt also reports bil lower abd pain worsening since last seen 12/18. Pt reports dx with BV, unable to afford Rx Flagyl. Pt also reports white thin vaginal foul smelling vaginal discharge. Pt reports nausea not improved with Rx Phenergan. Denies dysuria, hematuria, vaginal bleeding.  Pt also reports unable to get labetalol rx filled, has [facility].   OB History    Gravida Para Term Preterm AB TAB SAB Ectopic Multiple Living   1 0        0      Past Medical History  Diagnosis Date  . PCOS (polycystic ovarian syndrome)   . Obesity   . Hypertension     Past Surgical History  Procedure Laterality Date  . Arm surgery Left   . Tonsillectomy      Family History  Problem Relation Age of Onset  . Hypertension Mother   . Hypertension Father   . Diabetes Father   . Diabetes Brother     History  Substance Use Topics  . Smoking status: Former Smoker    Types: Cigarettes  . Smokeless tobacco: Never Used  . Alcohol Use: No    Allergies:  Allergies  Allergen Reactions  . Penicillins Hives    Prescriptions prior to admission  Medication Sig Dispense Refill Last Dose  . metoCLOPramide (REGLAN) 10 MG tablet Take 1 tablet (10 mg total) by mouth 3 (three) times daily as needed for nausea. 20 tablet 0 04/07/2014 at 0300  . metroNIDAZOLE (FLAGYL) 500 MG tablet Take 1 tablet (500 mg total) by mouth 2 (two) times daily. 14 tablet 0 Past Week at Unknown time  . labetalol (NORMODYNE) 100 MG tablet Take 2 tablets (200 mg total) by mouth 2 (two) times daily. (Patient not taking: Reported on 04/07/2014) 120 tablet 0     Review of Systems  Constitutional:  Negative for fever, chills, weight loss and malaise/fatigue.  Respiratory: Negative for shortness of breath.   Cardiovascular: Negative for chest pain.  Gastrointestinal: Positive for nausea, vomiting and abdominal pain. Negative for heartburn, diarrhea, constipation, blood in stool and melena.       Bil lower abd pain  Genitourinary: Negative for dysuria, urgency, frequency, hematuria and flank pain.  Musculoskeletal: Positive for back pain.   Physical Exam   Blood pressure 134/85, pulse 91, temperature 98.9 F (37.2 C), resp. rate 18, height 5' 7.5" (1.715 m), weight 257 lb 6.4 oz (116.756 kg), last menstrual period 02/02/2014, SpO2 100 %.  Physical Exam  Constitutional: She is oriented to person, place, and time. She appears well-developed and well-nourished.  Cracked dry lips, dry oral mucous membranes  Cardiovascular: Normal rate and normal heart sounds.   Respiratory: Effort normal and breath sounds normal. No respiratory distress.  GI: Soft. Bowel sounds are normal. She exhibits no distension. There is tenderness. There is no rebound.  bil lower abd pain and tenderness  Genitourinary: Vaginal discharge found.  Neurological: She is alert and oriented to person, place, and time.  Skin: Skin is warm and dry.  Psychiatric: She has a normal mood and affect. Her behavior is normal. Judgment and thought content  normal.    MAU Course  Procedures  MDM IVF and anti-emetics, pt reports no relief with phenergan IV, no relief with Pepcid IV,  minimal improvement with IV Reglan.   D5 IVF given with random glucose of 84. S/P 3L IVF, pt able to tolerate PO intake and reports moderate relief of nausea. Results for orders placed or performed during the hospital encounter of 04/07/14 (from the past 24 hour(s))  Urinalysis, Routine w reflex microscopic     Status: Abnormal   Collection Time: 04/07/14 10:50 AM  Result Value Ref Range   Color, Urine AMBER (A) YELLOW   APPearance CLOUDY (A) CLEAR    Specific Gravity, Urine >1.030 (H) 1.005 - 1.030   pH 6.0 5.0 - 8.0   Glucose, UA 100 (A) NEGATIVE mg/dL   Hgb urine dipstick LARGE (A) NEGATIVE   Bilirubin Urine LARGE (A) NEGATIVE   Ketones, ur >80 (A) NEGATIVE mg/dL   Protein, ur >409>300 (A) NEGATIVE mg/dL   Urobilinogen, UA 4.0 (H) 0.0 - 1.0 mg/dL   Nitrite NEGATIVE NEGATIVE   Leukocytes, UA NEGATIVE NEGATIVE  Urine microscopic-add on     Status: Abnormal   Collection Time: 04/07/14 10:50 AM  Result Value Ref Range   Squamous Epithelial / LPF MANY (A) RARE   WBC, UA 7-10 <3 WBC/hpf   RBC / HPF 7-10 <3 RBC/hpf   Bacteria, UA MANY (A) RARE   Urine-Other MUCOUS PRESENT   Glucose, capillary     Status: None   Collection Time: 04/07/14  4:38 PM  Result Value Ref Range   Glucose-Capillary 84 70 - 99 mg/dL    Assessment and Plan   1. Nausea and vomiting in pregnancy    P: Promethazine suppository 25mg  PR Q4-6h PRN nausea and vomiting Refer to health department for affordable/free Flagyl to treat BV. F[facility] 05/04/14.  Return if experiences increased nausea, vomiting and unable to tolerate PO intake.  Pt with previous elevated blood pressure - improved today. Patient may return to MAU as needed or if her condition were to change or worsen   [place]-InlandMicker Samios, FNP-S [place] Clark, Karen E 04/07/2014, 12:44 PM

## 2014-04-07 NOTE — Progress Notes (Signed)
Pt mentioned "is hard to breathe sometimes" but is nothing new. Has been going on awhile

## 2014-04-07 NOTE — MAU Note (Signed)
Patient states she has been vomiting and not able to keep anything down. Having abdominal cramping, no bleeding or discharge.

## 2014-04-07 NOTE — Discharge Instructions (Signed)
Eating Plan for Hyperemesis Gravidarum  Severe cases of hyperemesis gravidarum can lead to dehydration and malnutrition. The hyperemesis eating plan is one way to lessen the symptoms of nausea and vomiting. It is often used with prescribed medicines to control your symptoms.   WHAT CAN I DO TO RELIEVE MY SYMPTOMS?  Listen to your body. Everyone is different and has different preferences. Find what works best for you. Some of the following things may help:  · Eat and drink slowly.  · Eat 5-6 small meals daily instead of 3 large meals.    · Eat crackers before you get out of bed in the morning.    · Starchy foods are usually well tolerated (such as cereal, toast, bread, potatoes, pasta, rice, and pretzels).    · Ginger may help with nausea. Add ¼ tsp ground ginger to hot tea or choose ginger tea.    · Try drinking 100% fruit juice or an electrolyte drink.  · Continue to take your prenatal vitamins as directed by your health care provider. If you are having trouble taking your prenatal vitamins, talk with your health care provider about different options.  · Include at least 1 serving of protein with your meals and snacks (such as meats or poultry, beans, nuts, eggs, or yogurt). Try eating a protein-rich snack before bed (such as cheese and crackers or a half turkey or peanut butter sandwich).  WHAT THINGS SHOULD I AVOID TO REDUCE MY SYMPTOMS?  The following things may help reduce your symptoms:  · Avoid foods with strong smells. Try eating meals in well-ventilated areas that are free of odors.  · Avoid drinking water or other beverages with meals. Try not to drink anything less than 30 minutes before and after meals.  · Avoid drinking more than 1 cup of fluid at a time.  · Avoid fried or high-fat foods, such as butter and cream sauces.  · Avoid spicy foods.  · Avoid skipping meals the best you can. Nausea can be more intense on an empty stomach. If you cannot tolerate food at that time, do not force it. Try sucking on  ice chips or other frozen items and make up the calories later.  · Avoid lying down within 2 hours after eating.  Document Released: 01/29/2007 Document Revised: 04/08/2013 Document Reviewed: 02/05/2013  ExitCare® Patient Information ©2015 Exit[employer], LLC. This information is not intended to replace advice given to you by your health care provider. Make sure you discuss any questions you have with your health care provider.

## 2014-04-14 ENCOUNTER — Inpatient Hospital Stay (HOSPITAL_COMMUNITY)
Admission: AD | Admit: 2014-04-14 | Discharge: 2014-04-14 | Disposition: A | Discharge status: Home / Self Care | Payer: Medicaid Other | Source: Ambulatory Visit | Attending: Obstetrics & Gynecology | Admitting: Obstetrics & Gynecology

## 2014-04-14 ENCOUNTER — Encounter (HOSPITAL_COMMUNITY): Payer: Self-pay | Admitting: *Deleted

## 2014-04-14 DIAGNOSIS — O10011 Pre-existing essential hypertension complicating pregnancy, first trimester: Secondary | ICD-10-CM | POA: Insufficient documentation

## 2014-04-14 DIAGNOSIS — I10 Essential (primary) hypertension: Secondary | ICD-10-CM

## 2014-04-14 DIAGNOSIS — O211 Hyperemesis gravidarum with metabolic disturbance: Secondary | ICD-10-CM | POA: Insufficient documentation

## 2014-04-14 DIAGNOSIS — R109 Unspecified abdominal pain: Secondary | ICD-10-CM | POA: Diagnosis present

## 2014-04-14 DIAGNOSIS — O219 Vomiting of pregnancy, unspecified: Secondary | ICD-10-CM

## 2014-04-14 DIAGNOSIS — Z87891 Personal history of nicotine dependence: Secondary | ICD-10-CM | POA: Diagnosis not present

## 2014-04-14 DIAGNOSIS — Z3A09 9 weeks gestation of pregnancy: Secondary | ICD-10-CM | POA: Insufficient documentation

## 2014-04-14 DIAGNOSIS — E860 Dehydration: Secondary | ICD-10-CM

## 2014-04-14 LAB — URINALYSIS, ROUTINE W REFLEX MICROSCOPIC
Glucose, UA: 100 mg/dL — AB
Ketones, ur: >80 mg/dL — AB
Leukocytes, UA: NEGATIVE
NITRITE: NEGATIVE
Specific Gravity, Urine: >1.03 — ABNORMAL HIGH (ref 1.005–1.030)
UROBILINOGEN UA: 4 mg/dL — AB (ref 0.0–1.0)
pH: 6 (ref 5.0–8.0)

## 2014-04-14 LAB — BASIC METABOLIC PANEL
ANION GAP: 8 (ref 5–15)
BUN: 6 mg/dL (ref 6–23)
CHLORIDE: 98 meq/L (ref 96–112)
CO2: 29 mmol/L (ref 19–32)
Calcium: 9.5 mg/dL (ref 8.4–10.5)
Creatinine, Ser: 0.74 mg/dL (ref 0.50–1.10)
GFR calc Af Amer: >90 mL/min (ref >90)
GFR calc non Af Amer: >90 mL/min (ref >90)
Glucose, Bld: 149 mg/dL — ABNORMAL HIGH (ref 70–99)
POTASSIUM: 3.5 mmol/L (ref 3.5–5.1)
SODIUM: 135 mmol/L (ref 135–145)

## 2014-04-14 LAB — URINE MICROSCOPIC-ADD ON

## 2014-04-14 MED ORDER — PROMETHAZINE HCL 12.5 MG PO TABS: 12.5000 mg | ORAL_TABLET | Freq: Four times a day (QID) | ORAL | Status: DC | PRN

## 2014-04-14 MED ORDER — METOCLOPRAMIDE HCL 5 MG/ML IJ SOLN
10.0000 mg | Freq: Once | INTRAMUSCULAR | Status: AC
  Administered 2014-04-14: 10 mg via INTRAVENOUS
  Filled 2014-04-14: qty 2

## 2014-04-14 MED ORDER — PROMETHAZINE HCL 25 MG/ML IJ SOLN
25.0000 mg | Freq: Once | INTRAMUSCULAR | Status: AC
  Administered 2014-04-14: 25 mg via INTRAVENOUS
  Filled 2014-04-14: qty 1

## 2014-04-14 MED ORDER — LACTATED RINGERS IV BOLUS (SEPSIS)
1000.0000 mL | Freq: Once | INTRAVENOUS | Status: AC
  Administered 2014-04-14: 1000 mL via INTRAVENOUS

## 2014-04-14 NOTE — Discharge Instructions (Signed)

## 2014-04-14 NOTE — MAU Note (Signed)
Pt C/O continuous vomiting since she was here before.  Also C/O severe HA.  Pt states she vomited blood this a.m.  Has upper abd pain for the past 2 days, is worse with vomiting.  Denies bleeding.

## 2014-04-14 NOTE — MAU Note (Signed)
Pt was prescribed phenergan suppositories last visit to MAU, states it was going to cost over $90 so she did not get the prescription.

## 2014-04-14 NOTE — MAU Provider Note (Signed)
Chief Complaint: Emesis During Pregnancy and Abdominal Pain   First Provider Initiated Contact with Patient 04/14/14 1303     SUBJECTIVE HPI: Colleen Bradley is a 23 y.o. G1P0 at 8364w0d by LMP who presents Stating she's not be able to retain any solid food and any sips of water since her last visit for emesis on 04/07/2014. Has a headache and feels weak but denies other orthostatic symptoms. She has supply of Reglan but has not been retaining the med. Could not afford to fill Phenergan prescription and did not fill Flagyl prescription. States he mother is going to help her pay for meds. Has MC.  Not taking labetalol  Past Medical History  Diagnosis Date  . PCOS (polycystic ovarian syndrome)   . Obesity   . Hypertension    OB History  Gravida Para Term Preterm AB SAB TAB Ectopic Multiple Living  1 0        0    # Outcome Date GA Lbr Len/2nd Weight Sex Delivery Anes PTL Lv  1 Current              Past Surgical History  Procedure Laterality Date  . Arm surgery Left   . Tonsillectomy     History   Social History  . Marital Status: Single    Spouse Name: N/A    Number of Children: N/A  . Years of Education: N/A   Occupational History  . Not on file.   Social History Main Topics  . Smoking status: Former Smoker    Types: Cigarettes  . Smokeless tobacco: Never Used  . Alcohol Use: No  . Drug Use: No  . Sexual Activity: Yes    Birth Control/ Protection: None   Other Topics Concern  . Not on file   Social History Narrative   No current facility-administered medications on file prior to encounter.   Current Outpatient Prescriptions on File Prior to Encounter  Medication Sig Dispense Refill  . labetalol (NORMODYNE) 100 MG tablet Take 2 tablets (200 mg total) by mouth 2 (two) times daily. (Patient not taking: Reported on 04/07/2014) 120 tablet 0  . metoCLOPramide (REGLAN) 10 MG tablet Take 1 tablet (10 mg total) by mouth 3 (three) times daily as needed for nausea. 20 tablet 0   . metroNIDAZOLE (FLAGYL) 500 MG tablet Take 1 tablet (500 mg total) by mouth 2 (two) times daily. 14 tablet 0  . promethazine (PHENERGAN) 25 MG suppository Place 1 suppository (25 mg total) rectally every 6 (six) hours as needed for nausea or vomiting. 12 each 0   Allergies  Allergen Reactions  . Penicillins Hives    ROS: Pertinent items in HPI  OBJECTIVE Blood pressure 115/69, pulse 88, temperature 98 F (36.7 C), temperature source Oral, resp. rate 16, weight 113.399 kg (250 lb), last menstrual period 02/02/2014.  Wt loss 7# in past week; 276 on 03/23/14   GENERAL: Obese female appears fatigued  HEENT: Normocephalic HEART: normal rate RESP: normal effort ABDOMEN: Soft, non-tender EXTREMITIES: Nontender, no edema NEURO: Alert and oriented   LAB RESULTS Results for orders placed or performed during the hospital encounter of 04/14/14 (from the past 24 hour(s))  Urinalysis, Routine w reflex microscopic     Status: Abnormal   Collection Time: 04/14/14 12:47 PM  Result Value Ref Range   Color, Urine AMBER (A) YELLOW   APPearance CLOUDY (A) CLEAR   Specific Gravity, Urine >1.030 (H) 1.005 - 1.030   pH 6.0 5.0 - 8.0  Glucose, UA 100 (A) NEGATIVE mg/dL   Hgb urine dipstick MODERATE (A) NEGATIVE   Bilirubin Urine LARGE (A) NEGATIVE   Ketones, ur >80 (A) NEGATIVE mg/dL   Protein, ur >621>300 (A) NEGATIVE mg/dL   Urobilinogen, UA 4.0 (H) 0.0 - 1.0 mg/dL   Nitrite NEGATIVE NEGATIVE   Leukocytes, UA NEGATIVE NEGATIVE  Urine microscopic-add on     Status: Abnormal   Collection Time: 04/14/14 12:47 PM  Result Value Ref Range   Squamous Epithelial / LPF MANY (A) RARE   WBC, UA 7-10 <3 WBC/hpf   RBC / HPF 0-2 <3 RBC/hpf   Bacteria, UA FEW (A) RARE   Urine-Other MUCOUS PRESENT   Basic metabolic panel     Status: Abnormal   Collection Time: 04/14/14  3:30 PM  Result Value Ref Range   Sodium 135 135 - 145 mmol/L   Potassium 3.5 3.5 - 5.1 mmol/L   Chloride 98 96 - 112 mEq/L   CO2 29  19 - 32 mmol/L   Glucose, Bld 149 (H) 70 - 99 mg/dL   BUN 6 6 - 23 mg/dL   Creatinine, Ser 3.080.74 0.50 - 1.10 mg/dL   Calcium 9.5 8.4 - 65.710.5 mg/dL   GFR calc non Af Amer >90 >90 mL/min   GFR calc Af Amer >90 >90 mL/min   Anion gap 8 5 - 15    IMAGING [place] Ob Comp Less 14 Wks  03/23/2014   CLINICAL DATA:  Pregnant, cramping, beta HCG 7928  EXAM: OBSTETRIC <14 WK US AND TRANSVAGINAL OB US  TECHNIQUE: Both transabdominal and transvaginal ultrasound examinations were performed for complete evaluation of the gestation as well as the maternal uterus, adnexal regions, and pelvic cul-de-sac. Transvaginal technique was performed to assess early pregnancy.  COMPARISON:  None.  FINDINGS: Intrauterine gestational sac: Visualized/normal in shape.  Yolk sac:  Present  Embryo:  Present  Cardiac Activity: Present  Heart Rate:  88 bpm  CRL:   2.7  mm   5 w 6 d                  US EDC: 11/17/2014  Maternal uterus/adnexae: No subchorionic hemorrhage.  Right ovary is within normal limits, measuring 3.0 x 2.2 x 2.0 cm.  Left ovary is within normal limits, measuring 2.1 x 1.4 x 2.2 cm.  No free fluid.  IMPRESSION: Single live intrauterine gestation with estimated gestational age [redacted] weeks 6 days by crown-rump length.   Electronically Signed   By: Charline BillsSriyesh  Krishnan M.D.   On: 03/23/2014 15:41   [place] Ob Transvaginal  03/30/2014   CLINICAL DATA:  Abdominal pain in pregnancy.  EXAM: TRANSVAGINAL OB ULTRASOUND  TECHNIQUE: Transvaginal ultrasound was performed for complete evaluation of the gestation as well as the maternal uterus, adnexal regions, and pelvic cul-de-sac.  COMPARISON:  None.  FINDINGS: Intrauterine gestational sac: Visualized/normal in shape.  Yolk sac:  Visualized  Embryo:  Visualized  Cardiac Activity: Visualize  Heart Rate: 124 bpm  MSD:   mm    w     d  CRL:   7.0  mm   6 w 5 d                  US EDC: 11/18/2014  Maternal uterus/adnexae: Small subchorionic hemorrhage. No adnexal masses. Right corpus luteal cyst.  Small amount of free fluid in the pelvis.  IMPRESSION: Six week 5 day intrauterine pregnancy. Small subchorionic hemorrhage. Fetal heart rate 124 beats per min.  Electronically Signed   By: Charlett Nose M.D.   On: 03/30/2014 11:49   US Ob Transvaginal  03/23/2014   CLINICAL DATA:  Pregnant, cramping, beta HCG 7928  EXAM: OBSTETRIC <14 WK [place] AND TRANSVAGINAL OB US  TECHNIQUE: Both transabdominal and transvaginal ultrasound examinations were performed for complete evaluation of the gestation as well as the maternal uterus, adnexal regions, and pelvic cul-de-sac. Transvaginal technique was performed to assess early pregnancy.  COMPARISON:  None.  FINDINGS: Intrauterine gestational sac: Visualized/normal in shape.  Yolk sac:  Present  Embryo:  Present  Cardiac Activity: Present  Heart Rate:  88 bpm  CRL:   2.7  mm   5 w 6 d                  [place] EDC: 11/17/2014  Maternal uterus/adnexae: No subchorionic hemorrhage.  Right ovary is within normal limits, measuring 3.0 x 2.2 x 2.0 cm.  Left ovary is within normal limits, measuring 2.1 x 1.4 x 2.2 cm.  No free fluid.  IMPRESSION: Single live intrauterine gestation with estimated gestational age [redacted] weeks 6 days by crown-rump length.   Electronically Signed   By: Charline Bills M.D.   On: 03/23/2014 15:41    MAU COURSE IV D5LR 1 liter with Phenergan 25mg  1510: better but still nauseated when tried Sprite; H/A resolved IV #2: LR 1 liter with Reglan 10mg   ASSESSMENT 1. Nausea and vomiting during pregnancy prior to [redacted] weeks gestation   2. Dehydration   3. HYPERTENSION, BENIGN SYSTEMIC   G1 at [redacted]w[redacted]d  PLAN Discharge home AVS on N/V in pregnancy If unable to fill other prescriptions, advised to get meclizine 25 mg 1 every 6 hours when necessary 100 tabs at [facility]    Medication List    STOP taking these medications        labetalol 100 MG tablet  Commonly known as:  NORMODYNE     metroNIDAZOLE 500 MG tablet  Commonly known as:  FLAGYL     promethazine  25 MG suppository  Commonly known as:  PHENERGAN  Replaced by:  promethazine 12.5 MG tablet      TAKE these medications        metoCLOPramide 10 MG tablet  Commonly known as:  REGLAN  Take 1 tablet (10 mg total) by mouth 3 (three) times daily as needed for nausea.     promethazine 12.5 MG tablet  Commonly known as:  PHENERGAN  Take 1 tablet (12.5 mg total) by mouth every 6 (six) hours as needed for nausea or vomiting.         Danae Orleans, CNM 04/14/2014  1:04 PM

## 2014-04-28 ENCOUNTER — Telehealth: Payer: Self-pay | Admitting: Obstetrics and Gynecology

## 2014-04-28 ENCOUNTER — Encounter: Payer: Self-pay | Admitting: Obstetrics and Gynecology

## 2014-04-28 ENCOUNTER — Encounter: Payer: Medicaid Other | Admitting: Obstetrics and Gynecology

## 2014-04-28 NOTE — Telephone Encounter (Signed)
Called patient left message for patient to return call to clinic. Mailing certified letter to patient regarding missed OB appointment.

## 2014-05-05 ENCOUNTER — Encounter: Payer: Self-pay | Admitting: General Practice

## 2015-01-26 ENCOUNTER — Encounter (HOSPITAL_COMMUNITY): Payer: Self-pay | Admitting: *Deleted

## 2015-07-09 ENCOUNTER — Encounter (HOSPITAL_COMMUNITY): Payer: Self-pay | Admitting: Emergency Medicine

## 2015-07-09 ENCOUNTER — Emergency Department (HOSPITAL_COMMUNITY)
Admission: EM | Admit: 2015-07-09 | Discharge: 2015-07-09 | Disposition: A | Discharge status: Home / Self Care | Payer: Medicaid Other | Attending: Emergency Medicine | Admitting: Emergency Medicine

## 2015-07-09 DIAGNOSIS — E669 Obesity, unspecified: Secondary | ICD-10-CM | POA: Insufficient documentation

## 2015-07-09 DIAGNOSIS — I10 Essential (primary) hypertension: Secondary | ICD-10-CM | POA: Insufficient documentation

## 2015-07-09 DIAGNOSIS — N898 Other specified noninflammatory disorders of vagina: Secondary | ICD-10-CM | POA: Diagnosis present

## 2015-07-09 DIAGNOSIS — Z3202 Encounter for pregnancy test, result negative: Secondary | ICD-10-CM | POA: Diagnosis not present

## 2015-07-09 DIAGNOSIS — N760 Acute vaginitis: Secondary | ICD-10-CM | POA: Insufficient documentation

## 2015-07-09 DIAGNOSIS — Z880 Allergy status to penicillin: Secondary | ICD-10-CM | POA: Diagnosis not present

## 2015-07-09 DIAGNOSIS — Z87891 Personal history of nicotine dependence: Secondary | ICD-10-CM | POA: Diagnosis not present

## 2015-07-09 DIAGNOSIS — B9689 Other specified bacterial agents as the cause of diseases classified elsewhere: Secondary | ICD-10-CM

## 2015-07-09 LAB — WET PREP, GENITAL
SPERM: NONE SEEN
Trich, Wet Prep: NONE SEEN
Yeast Wet Prep HPF POC: NONE SEEN

## 2015-07-09 LAB — URINALYSIS, ROUTINE W REFLEX MICROSCOPIC
Bilirubin Urine: NEGATIVE
GLUCOSE, UA: NEGATIVE mg/dL
Ketones, ur: NEGATIVE mg/dL
Leukocytes, UA: NEGATIVE
Nitrite: NEGATIVE
Protein, ur: NEGATIVE mg/dL
SPECIFIC GRAVITY, URINE: 1.023 (ref 1.005–1.030)
pH: 6.5 (ref 5.0–8.0)

## 2015-07-09 LAB — POC URINE PREG, ED: PREG TEST UR: NEGATIVE

## 2015-07-09 LAB — URINE MICROSCOPIC-ADD ON

## 2015-07-09 MED ORDER — METRONIDAZOLE 500 MG PO TABS: 500.0000 mg | ORAL_TABLET | Freq: Two times a day (BID) | ORAL | Status: DC

## 2015-07-09 NOTE — ED Provider Notes (Signed)
CSN: [id number]     Arrival date & time 07/09/15  0802 History   First MD Initiated Contact with Patient 07/09/15 0913     Chief Complaint  Patient presents with  . Vaginal Discharge  . Abdominal Pain     (Consider location/radiation/quality/duration/timing/severity/associated sxs/prior Treatment) HPI Colleen Bradley is a 25 y.o. female with history of polycystic ovarian syndrome and hypertension, presents to emergency department complaining of lower abdominal cramping. Patient states she has had cramping on and off for 3 weeks. She states she is also having some white thin vaginal discharge that has been increasing over the same period of time. She reports history of patella vaginosis and states this feels the same. She has been using lavender douches. She is not sure if she is pregnant, however states her last menstrual cycle was at the end of February. She denies possibility of an STD but still wants to be checked. She denies any fever or chills. No nausea or vomiting. No back pain. Denies any urinary symptoms. No painful intercourse. Nothing making her symptoms better or worse.   Past Medical History  Diagnosis Date  . PCOS (polycystic ovarian syndrome)   . Obesity   . Hypertension    Past Surgical History  Procedure Laterality Date  . Arm surgery Left   . Tonsillectomy     Family History  Problem Relation Age of Onset  . Hypertension Mother   . Hypertension Father   . Diabetes Father   . Diabetes Brother    Social History  Substance Use Topics  . Smoking status: Former Smoker    Types: Cigarettes  . Smokeless tobacco: Never Used  . Alcohol Use: No   OB History    Gravida Para Term Preterm AB TAB SAB Ectopic Multiple Living   1 0        0     Review of Systems  Constitutional: Negative for fever and chills.  Respiratory: Negative for cough, chest tightness and shortness of breath.   Cardiovascular: Negative for chest pain, palpitations and leg swelling.   Gastrointestinal: Positive for abdominal pain. Negative for nausea, vomiting and diarrhea.  Genitourinary: Positive for vaginal discharge and pelvic pain. Negative for dysuria, flank pain, vaginal bleeding and vaginal pain.  Musculoskeletal: Negative for myalgias, arthralgias, neck pain and neck stiffness.  Skin: Negative for rash.  Neurological: Negative for dizziness, weakness and headaches.  All other systems reviewed and are negative.     Allergies  Penicillins  Home Medications   Prior to Admission medications   Medication Sig Start Date End Date Taking? Authorizing Provider  metoCLOPramide (REGLAN) 10 MG tablet Take 1 tablet (10 mg total) by mouth 3 (three) times daily as needed for nausea. 04/02/14   Bertram Denver, PA-C  promethazine (PHENERGAN) 12.5 MG tablet Take 1 tablet (12.5 mg total) by mouth every 6 (six) hours as needed for nausea or vomiting. 04/14/14   Deirdre C Poe, CNM   BP 139/81 mmHg  Pulse 86  Temp(Src) 98.1 F (36.7 C) (Oral)  Resp 16  SpO2 92% Physical Exam  Constitutional: She is oriented to person, place, and time. She appears well-developed and well-nourished. No distress.  HENT:  Head: Normocephalic.  Eyes: Conjunctivae are normal.  Neck: Neck supple.  Cardiovascular: Normal rate, regular rhythm and normal heart sounds.   Pulmonary/Chest: Effort normal and breath sounds normal. No respiratory distress. She has no wheezes. She has no rales.  Abdominal: Soft. Bowel sounds are normal. She exhibits no  distension. There is no tenderness. There is no rebound and no guarding.  Genitourinary:  Normal external genitalia. Normal vaginal canal. Small thin white discharge. Cervix is normal, closed. No CMT. No uterine or adnexal tenderness. No masses palpated.    Musculoskeletal: She exhibits no edema.  Neurological: She is alert and oriented to person, place, and time.  Skin: Skin is warm and dry.  Psychiatric: She has a normal mood and affect. Her  behavior is normal.  Nursing note and vitals reviewed.   ED Course  Procedures (including critical care time) Labs Review Labs Reviewed  WET PREP, GENITAL - Abnormal; Notable for the following:    Clue Cells Wet Prep HPF POC PRESENT (*)    WBC, Wet Prep HPF POC MANY (*)    All other components within normal limits  URINALYSIS, ROUTINE W REFLEX MICROSCOPIC (NOT AT [facility]) - Abnormal; Notable for the following:    Hgb urine dipstick SMALL (*)    All other components within normal limits  URINE MICROSCOPIC-ADD ON - Abnormal; Notable for the following:    Squamous Epithelial / LPF 0-5 (*)    Bacteria, UA RARE (*)    All other components within normal limits  POC URINE PREG, ED  GC/CHLAMYDIA PROBE AMP ([place]) NOT AT [facility]    Imaging Review No results found. I have personally reviewed and evaluated these images and lab results as part of my medical decision-making.   EKG Interpretation None      MDM   Final diagnoses:  Bacterial vaginosis   Patient with lower abdominal cramping and white vaginal discharge for 3 weeks. History of BV and feels the same. Pelvic exam showed thin white discharge, no cervical motion tenderness, no uterine or adnexal tenderness.   Wet prep showing many white blood cells and present clue cells. We'll treat for BV. Patient does not think she has an STI we'll hold off on treating. She does not have history of gonorrhea chlamydia in the past. She does have history of trichomonas but no Trichomonas seen on wet prep today. Will discharge home, either present or Tylenol for cramping, Flagyl for infection. Follow-up with primary care doctor or OB/GYN. Gonorrhea Chlamydia cultures are pending  Filed Vitals:   07/09/15 0806 07/09/15 1129  BP: 139/81 119/86  Pulse: 86 80  Temp: 98.1 F (36.7 C) 98 F (36.7 C)  TempSrc: Oral Oral  Resp: 16 18  SpO2: 92% 100%     Jaynie Crumbleatyana Angellina Ferdinand, PA-C 07/09/15 1609  Rolan BuccoMelanie Belfi, MD 07/09/15 1612

## 2015-07-09 NOTE — Discharge Instructions (Signed)
Take flagyl as prescribed until all gone. See information below. Your tests for gonorrhea and chlamydia still pending and if come back positive you will be notified.    Bacterial Vaginosis Bacterial vaginosis is a vaginal infection that occurs when the normal balance of bacteria in the vagina is disrupted. It results from an overgrowth of certain bacteria. This is the most common vaginal infection in women of childbearing age. Treatment is important to prevent complications, especially in pregnant women, as it can cause a premature delivery. CAUSES  Bacterial vaginosis is caused by an increase in harmful bacteria that are normally present in smaller amounts in the vagina. Several different kinds of bacteria can cause bacterial vaginosis. However, the reason that the condition develops is not fully understood. RISK FACTORS Certain activities or behaviors can put you at an increased risk of developing bacterial vaginosis, including:  Having a new sex partner or multiple sex partners.  Douching.  Using an intrauterine device (IUD) for contraception. Women do not get bacterial vaginosis from toilet seats, bedding, swimming pools, or contact with objects around them. SIGNS AND SYMPTOMS  Some women with bacterial vaginosis have no signs or symptoms. Common symptoms include:  Grey vaginal discharge.  A fishlike odor with discharge, especially after sexual intercourse.  Itching or burning of the vagina and vulva.  Burning or pain with urination. DIAGNOSIS  Your health care provider will take a medical history and examine the vagina for signs of bacterial vaginosis. A sample of vaginal fluid may be taken. Your health care provider will look at this sample under a microscope to check for bacteria and abnormal cells. A vaginal pH test may also be done.  TREATMENT  Bacterial vaginosis may be treated with antibiotic medicines. These may be given in the form of a pill or a vaginal cream. A second  round of antibiotics may be prescribed if the condition comes back after treatment. Because bacterial vaginosis increases your risk for sexually transmitted diseases, getting treated can help reduce your risk for chlamydia, gonorrhea, HIV, and herpes. HOME CARE INSTRUCTIONS   Only take over-the-counter or prescription medicines as directed by your health care provider.  If antibiotic medicine was prescribed, take it as directed. Make sure you finish it even if you start to feel better.  Tell all sexual partners that you have a vaginal infection. They should see their health care provider and be treated if they have problems, such as a mild rash or itching.  During treatment, it is important that you follow these instructions:  Avoid sexual activity or use condoms correctly.  Do not douche.  Avoid alcohol as directed by your health care provider.  Avoid breastfeeding as directed by your health care provider. SEEK MEDICAL CARE IF:   Your symptoms are not improving after 3 days of treatment.  You have increased discharge or pain.  You have a fever. MAKE SURE YOU:   Understand these instructions.  Will watch your condition.  Will get help right away if you are not doing well or get worse. FOR MORE INFORMATION  Centers for Disease Control and Prevention, Division of STD Prevention: SolutionApps.co.zawww.cdc.gov/std American Sexual Health Association (ASHA): www.ashastd.org    This information is not intended to replace advice given to you by your health care provider. Make sure you discuss any questions you have with your health care provider.   Document Released: 04/03/2005 Document Revised: 04/24/2014 Document Reviewed: 11/13/2012 Elsevier Interactive Patient Education [employer]2016 Elsevier Inc.

## 2015-07-09 NOTE — ED Notes (Signed)
Pt reports vaginal discharge and lower midline abd pain for the past week. Amount of discharge has gradually increased. LMP 2/28.

## 2015-07-12 LAB — GC/CHLAMYDIA PROBE AMP (~~LOC~~) NOT AT ARMC
Chlamydia: NEGATIVE
NEISSERIA GONORRHEA: NEGATIVE

## 2016-02-07 ENCOUNTER — Ambulatory Visit: Payer: Medicaid Other | Admitting: Family Medicine

## 2016-05-06 ENCOUNTER — Emergency Department (HOSPITAL_COMMUNITY)
Admission: EM | Admit: 2016-05-06 | Discharge: 2016-05-06 | Disposition: A | Discharge status: Home / Self Care | Payer: Medicaid Other | Attending: Emergency Medicine | Admitting: Emergency Medicine

## 2016-05-06 ENCOUNTER — Encounter (HOSPITAL_COMMUNITY): Payer: Self-pay | Admitting: Emergency Medicine

## 2016-05-06 DIAGNOSIS — N760 Acute vaginitis: Secondary | ICD-10-CM | POA: Insufficient documentation

## 2016-05-06 DIAGNOSIS — Z7984 Long term (current) use of oral hypoglycemic drugs: Secondary | ICD-10-CM | POA: Diagnosis not present

## 2016-05-06 DIAGNOSIS — B9689 Other specified bacterial agents as the cause of diseases classified elsewhere: Secondary | ICD-10-CM | POA: Diagnosis not present

## 2016-05-06 DIAGNOSIS — I10 Essential (primary) hypertension: Secondary | ICD-10-CM | POA: Insufficient documentation

## 2016-05-06 DIAGNOSIS — N898 Other specified noninflammatory disorders of vagina: Secondary | ICD-10-CM | POA: Diagnosis present

## 2016-05-06 DIAGNOSIS — Z79899 Other long term (current) drug therapy: Secondary | ICD-10-CM | POA: Insufficient documentation

## 2016-05-06 DIAGNOSIS — Z87891 Personal history of nicotine dependence: Secondary | ICD-10-CM | POA: Diagnosis not present

## 2016-05-06 DIAGNOSIS — N390 Urinary tract infection, site not specified: Secondary | ICD-10-CM | POA: Diagnosis not present

## 2016-05-06 LAB — URINALYSIS, ROUTINE W REFLEX MICROSCOPIC
Bilirubin Urine: NEGATIVE
Glucose, UA: NEGATIVE mg/dL
KETONES UR: NEGATIVE mg/dL
Nitrite: NEGATIVE
PH: 7 (ref 5.0–8.0)
Protein, ur: NEGATIVE mg/dL
SPECIFIC GRAVITY, URINE: 1.015 (ref 1.005–1.030)

## 2016-05-06 LAB — COMPREHENSIVE METABOLIC PANEL
ALBUMIN: 3.9 g/dL (ref 3.5–5.0)
ALT: 13 U/L — AB (ref 14–54)
AST: 16 U/L (ref 15–41)
Alkaline Phosphatase: 50 U/L (ref 38–126)
Anion gap: 9 (ref 5–15)
BUN: 8 mg/dL (ref 6–20)
CHLORIDE: 103 mmol/L (ref 101–111)
CO2: 25 mmol/L (ref 22–32)
CREATININE: 0.76 mg/dL (ref 0.44–1.00)
Calcium: 9.2 mg/dL (ref 8.9–10.3)
GFR calc Af Amer: >60 mL/min (ref >60)
GFR calc non Af Amer: >60 mL/min (ref >60)
Glucose, Bld: 110 mg/dL — ABNORMAL HIGH (ref 65–99)
Potassium: 3.9 mmol/L (ref 3.5–5.1)
SODIUM: 137 mmol/L (ref 135–145)
Total Bilirubin: 0.4 mg/dL (ref 0.3–1.2)
Total Protein: 7.5 g/dL (ref 6.5–8.1)

## 2016-05-06 LAB — LIPASE, BLOOD: LIPASE: 99 U/L — AB (ref 11–51)

## 2016-05-06 LAB — CBC
HEMATOCRIT: 42.7 % (ref 36.0–46.0)
Hemoglobin: 14.2 g/dL (ref 12.0–15.0)
MCH: 29.5 pg (ref 26.0–34.0)
MCHC: 33.3 g/dL (ref 30.0–36.0)
MCV: 88.8 fL (ref 78.0–100.0)
PLATELETS: 382 10*3/uL (ref 150–400)
RBC: 4.81 MIL/uL (ref 3.87–5.11)
RDW: 13.1 % (ref 11.5–15.5)
WBC: 8.3 10*3/uL (ref 4.0–10.5)

## 2016-05-06 LAB — WET PREP, GENITAL
Sperm: NONE SEEN
Trich, Wet Prep: NONE SEEN
Yeast Wet Prep HPF POC: NONE SEEN

## 2016-05-06 LAB — PREGNANCY, URINE: Preg Test, Ur: NEGATIVE

## 2016-05-06 MED ORDER — CEFTRIAXONE SODIUM 250 MG IJ SOLR
250.0000 mg | Freq: Once | INTRAMUSCULAR | Status: AC
  Administered 2016-05-06: 250 mg via INTRAMUSCULAR
  Filled 2016-05-06: qty 250

## 2016-05-06 MED ORDER — CEPHALEXIN 500 MG PO CAPS: 500.0000 mg | ORAL_CAPSULE | Freq: Two times a day (BID) | ORAL | 0 refills | Status: DC

## 2016-05-06 MED ORDER — METRONIDAZOLE 500 MG PO TABS: 500.0000 mg | ORAL_TABLET | Freq: Two times a day (BID) | ORAL | 0 refills | Status: DC

## 2016-05-06 MED ORDER — AZITHROMYCIN 250 MG PO TABS
1000.0000 mg | ORAL_TABLET | Freq: Once | ORAL | Status: AC
  Administered 2016-05-06: 1000 mg via ORAL
  Filled 2016-05-06: qty 4

## 2016-05-06 MED ORDER — LIDOCAINE HCL (PF) 1 % IJ SOLN
INTRAMUSCULAR | Status: AC
  Administered 2016-05-06: 2 mL
  Filled 2016-05-06: qty 5

## 2016-05-06 NOTE — ED Notes (Signed)
ED Provider at bedside. 

## 2016-05-06 NOTE — ED Triage Notes (Signed)
Pt c/o abdominal cramping and white vaginal discharge x 1 month. Pt denies N/V.

## 2016-05-06 NOTE — Discharge Instructions (Signed)
Medications: Keflex, Flagyl  Treatment: Take Keflex twice daily for your urinary tract infection. Take Flagyl twice daily for your bacterial vaginosis. You have been treated for gonorrhea and chlamydia. You will be called in 2-3 days if any of your results return positive. If  you have a positive result for HIV or syphilis, please see treatment health department or your primary care provider.  Follow-up: [facility] for your annual exam and further evaluation if your symptoms are not improving. Please return to the emergency department if you develop any new or worsening symptoms.

## 2016-05-06 NOTE — ED Provider Notes (Signed)
[facility]-EMERGENCY DEPT Provider Note   CSN: [id number] Arrival date & time: 05/06/16  1143     History   Chief Complaint Chief Complaint  Patient presents with  . Abdominal Cramping  . Vaginal Discharge    HPI Colleen KWASNIK is a 26 y.o. female with history of PCOS who presents with a 1 month history of intermittent suprapubic cramping and white vaginal discharge. Patient states her cramping is not intolerable and he has not been every day for the past month. Patient denies any other abdominal pain. Patient reports spotting on 04/26/2013, however was not like her regular menstrual cycle. Patient has not taken any medications for her symptoms. Patient denies any recurrent urinary symptoms. She also reports recently seeing a green discharge upon wiping that she has not seen before. Patient has no concern for STD exposure, as she has one partner, however she states she cannot be sure. She denies any fevers, chest pain, shortness of breath, nausea, vomiting.    HPI  Past Medical History:  Diagnosis Date  . Hypertension   . Obesity   . PCOS (polycystic ovarian syndrome)     Patient Active Problem List   Diagnosis Date Noted  . POLYCYSTIC OVARY 06/14/2006  . OBESITY, NOS 06/14/2006  . HYPERTENSION, BENIGN SYSTEMIC 06/14/2006  . METRORRHAGIA 06/14/2006  . ACNE 06/14/2006    Past Surgical History:  Procedure Laterality Date  . arm surgery Left   . TONSILLECTOMY      OB History    Gravida Para Term Preterm AB Living   1 0       0   SAB TAB Ectopic Multiple Live Births                   Home Medications    Prior to Admission medications   Medication Sig Start Date End Date Taking? Authorizing Provider  metFORMIN (GLUCOPHAGE) 500 MG tablet Take 1 tablet by mouth once a day 06/16/15  Yes Historical Provider, MD  oxyCODONE-acetaminophen (PERCOCET) 10-325 MG tablet Take 1 tablet by mouth 3 (three) times daily as needed. for pain 04/08/16  Yes Historical Provider, MD  cephALEXin  (KEFLEX) 500 MG capsule Take 1 capsule (500 mg total) by mouth 2 (two) times daily. 05/06/16   Emi Holes, PA-C  metroNIDAZOLE (FLAGYL) 500 MG tablet Take 1 tablet (500 mg total) by mouth 2 (two) times daily. 05/06/16   Emi Holes, PA-C    Family History Family History  Problem Relation Age of Onset  . Hypertension Mother   . Hypertension Father   . Diabetes Father   . Diabetes Brother     Social History Social History  Substance Use Topics  . Smoking status: Former Smoker    Types: Cigarettes  . Smokeless tobacco: Never Used  . Alcohol use No     Allergies   Penicillins   Review of Systems Review of Systems  Constitutional: Negative for chills and fever.  HENT: Negative for facial swelling and sore throat.   Respiratory: Negative for shortness of breath.   Cardiovascular: Negative for chest pain.  Gastrointestinal: Positive for abdominal pain (lower cramping). Negative for nausea and vomiting.  Genitourinary: Positive for vaginal discharge. Negative for dysuria, pelvic pain and vaginal bleeding.  Musculoskeletal: Negative for back pain.  Skin: Negative for rash and wound.  Neurological: Negative for headaches.  Psychiatric/Behavioral: The patient is not nervous/anxious.      Physical Exam Updated Vital Signs BP 139/95   Pulse 79  Temp 98.9 F (37.2 C) (Oral)   Resp 15   Ht 5\' 7"  (1.702 m)   Wt 133.8 kg   LMP 04/26/2016   SpO2 100%   BMI 46.21 kg/m   Physical Exam  Constitutional: She appears well-developed and well-nourished. No distress.  HENT:  Head: Normocephalic and atraumatic.  Mouth/Throat: Oropharynx is clear and moist. No oropharyngeal exudate.  Eyes: Conjunctivae are normal. Pupils are equal, round, and reactive to light. Right eye exhibits no discharge. Left eye exhibits no discharge. No scleral icterus.  Neck: Normal range of motion. Neck supple. No thyromegaly present.  Cardiovascular: Normal rate, regular rhythm, normal heart  sounds and intact distal pulses.  Exam reveals no gallop and no friction rub.   No murmur heard. Pulmonary/Chest: Effort normal and breath sounds normal. No stridor. No respiratory distress. She has no wheezes. She has no rales.  Abdominal: Soft. Bowel sounds are normal. She exhibits no distension. There is tenderness in the suprapubic area. There is no rebound and no guarding.  Genitourinary: Uterus is not tender. Cervix exhibits discharge (white, milky). Cervix exhibits no motion tenderness. Right adnexum displays no mass, no tenderness and no fullness. Left adnexum displays no mass and no tenderness. Vaginal discharge (white, milky and green (could be physiologic)) found.  Musculoskeletal: She exhibits no edema.  Lymphadenopathy:    She has no cervical adenopathy.  Neurological: She is alert. Coordination normal.  Skin: Skin is warm and dry. No rash noted. She is not diaphoretic. No pallor.  Psychiatric: She has a normal mood and affect.  Nursing note and vitals reviewed.    ED Treatments / Results  Labs (all labs ordered are listed, but only abnormal results are displayed) Labs Reviewed  WET PREP, GENITAL - Abnormal; Notable for the following:       Result Value   Clue Cells Wet Prep HPF POC PRESENT (*)    WBC, Wet Prep HPF POC MANY (*)    All other components within normal limits  LIPASE, BLOOD - Abnormal; Notable for the following:    Lipase 99 (*)    All other components within normal limits  COMPREHENSIVE METABOLIC PANEL - Abnormal; Notable for the following:    Glucose, Bld 110 (*)    ALT 13 (*)    All other components within normal limits  URINALYSIS, ROUTINE W REFLEX MICROSCOPIC - Abnormal; Notable for the following:    APPearance CLOUDY (*)    Hgb urine dipstick MODERATE (*)    Leukocytes, UA LARGE (*)    Bacteria, UA RARE (*)    Squamous Epithelial / LPF 6-30 (*)    All other components within normal limits  URINE CULTURE  CBC  PREGNANCY, URINE  RPR  HIV  ANTIBODY (ROUTINE TESTING)  GC/CHLAMYDIA PROBE AMP ([place]) NOT AT [facility]    EKG  EKG Interpretation None       Radiology No results found.  Procedures Procedures (including critical care time)  Medications Ordered in ED Medications  cefTRIAXone (ROCEPHIN) injection 250 mg (not administered)  azithromycin (ZITHROMAX) tablet 1,000 mg (not administered)     Initial Impression / Assessment and Plan / ED Course  I have reviewed the triage vital signs and the nursing notes.  Pertinent labs & imaging results that were available during my care of the patient were reviewed by me and considered in my medical decision making (see chart for details).     Patient with probable urinary tract infection and bacterial vaginosis, as well as possible  STD. UA shows moderate hematuria, large leukocytes, rare bacteria, 6-30 WBCs. Urine culture sent. Urine pregnancy negative. CBC unremarkable. CMP shows glucose 110, ALT 13. Lipase 99. Patient without upper abdominal symptoms. Wet prep shows positive clue cells and many WBCs. HIV, RPR, GC/chlamydia sent. Patient opts to have treatment considering change in discharge. Rocephin and azithromycin given in ED. Discharged home with Keflex and Flagyl. Patient to be called in 2-3 days for positive results and advised to seek treatment at the health department for positive HIV or RPR. [facility] and further evaluation if symptoms are not improving. Return precautions discussed. Patient understands and agrees with plan. Patient vitals stable throughout ED course and discharged in satisfactory condition.  Final Clinical Impressions(s) / ED Diagnoses   Final diagnoses:  BV (bacterial vaginosis)  Lower urinary tract infectious disease    New Prescriptions New Prescriptions   CEPHALEXIN (KEFLEX) 500 MG CAPSULE    Take 1 capsule (500 mg total) by mouth 2 (two) times daily.   METRONIDAZOLE (FLAGYL) 500 MG  TABLET    Take 1 tablet (500 mg total) by mouth 2 (two) times daily.     Emi Holeslexandra M Ankita Newcomer, PA-C 05/06/16 1634    Tilden FossaElizabeth Rees, MD 05/08/16 1227

## 2016-05-06 NOTE — ED Notes (Signed)
Pt stable, understands discharge instructions, and reasons for return.   

## 2016-05-07 LAB — HIV ANTIBODY (ROUTINE TESTING W REFLEX): HIV Screen 4th Generation wRfx: NONREACTIVE

## 2016-05-07 LAB — RPR: RPR Ser Ql: NONREACTIVE

## 2016-05-08 LAB — URINE CULTURE: Special Requests: NORMAL

## 2016-05-08 LAB — GC/CHLAMYDIA PROBE AMP (~~LOC~~) NOT AT ARMC
Chlamydia: NEGATIVE
Neisseria Gonorrhea: NEGATIVE

## 2016-11-22 DIAGNOSIS — Y999 Unspecified external cause status: Secondary | ICD-10-CM | POA: Diagnosis not present

## 2016-11-22 DIAGNOSIS — S6992XA Unspecified injury of left wrist, hand and finger(s), initial encounter: Secondary | ICD-10-CM | POA: Diagnosis present

## 2016-11-22 DIAGNOSIS — Z87891 Personal history of nicotine dependence: Secondary | ICD-10-CM | POA: Insufficient documentation

## 2016-11-22 DIAGNOSIS — I10 Essential (primary) hypertension: Secondary | ICD-10-CM | POA: Insufficient documentation

## 2016-11-22 DIAGNOSIS — Y939 Activity, unspecified: Secondary | ICD-10-CM | POA: Diagnosis not present

## 2016-11-22 DIAGNOSIS — X58XXXA Exposure to other specified factors, initial encounter: Secondary | ICD-10-CM | POA: Insufficient documentation

## 2016-11-22 DIAGNOSIS — Y929 Unspecified place or not applicable: Secondary | ICD-10-CM | POA: Diagnosis not present

## 2016-11-22 DIAGNOSIS — S62655A Nondisplaced fracture of medial phalanx of left ring finger, initial encounter for closed fracture: Secondary | ICD-10-CM | POA: Diagnosis not present

## 2016-11-23 ENCOUNTER — Emergency Department (HOSPITAL_COMMUNITY)
Admission: EM | Admit: 2016-11-23 | Discharge: 2016-11-23 | Disposition: A | Discharge status: Home / Self Care | Payer: Medicaid Other | Attending: Emergency Medicine | Admitting: Emergency Medicine

## 2016-11-23 ENCOUNTER — Emergency Department (HOSPITAL_COMMUNITY): Payer: Medicaid Other

## 2016-11-23 ENCOUNTER — Encounter (HOSPITAL_COMMUNITY): Payer: Self-pay | Admitting: Emergency Medicine

## 2016-11-23 DIAGNOSIS — S62655A Nondisplaced fracture of medial phalanx of left ring finger, initial encounter for closed fracture: Secondary | ICD-10-CM
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
## 2016-11-23 NOTE — Discharge Instructions (Signed)
Wear a finger splint for stability, to ensure proper healing. We advise icing 3-4 times per day as well as ibuprofen every 6 hours, as needed, for pain. Follow-up with a hand specialist as soon as you are able. You may return to the emergency department, as needed, for new or concerning symptoms.

## 2016-11-27 ENCOUNTER — Emergency Department (HOSPITAL_COMMUNITY)
Admission: EM | Admit: 2016-11-27 | Discharge: 2016-11-27 | Disposition: A | Discharge status: Home / Self Care | Payer: Medicaid Other | Attending: Emergency Medicine | Admitting: Emergency Medicine

## 2016-11-27 ENCOUNTER — Emergency Department (HOSPITAL_COMMUNITY): Payer: Medicaid Other

## 2016-11-27 ENCOUNTER — Encounter (HOSPITAL_COMMUNITY): Payer: Self-pay | Admitting: *Deleted

## 2016-11-27 DIAGNOSIS — Y92410 Unspecified street and highway as the place of occurrence of the external cause: Secondary | ICD-10-CM | POA: Insufficient documentation

## 2016-11-27 DIAGNOSIS — Y939 Activity, unspecified: Secondary | ICD-10-CM | POA: Diagnosis not present

## 2016-11-27 DIAGNOSIS — Z79899 Other long term (current) drug therapy: Secondary | ICD-10-CM | POA: Insufficient documentation

## 2016-11-27 DIAGNOSIS — M542 Cervicalgia: Secondary | ICD-10-CM | POA: Insufficient documentation

## 2016-11-27 DIAGNOSIS — I10 Essential (primary) hypertension: Secondary | ICD-10-CM | POA: Diagnosis not present

## 2016-11-27 DIAGNOSIS — Z87891 Personal history of nicotine dependence: Secondary | ICD-10-CM | POA: Diagnosis not present

## 2016-11-27 DIAGNOSIS — Y998 Other external cause status: Secondary | ICD-10-CM | POA: Diagnosis not present

## 2016-11-27 DIAGNOSIS — M549 Dorsalgia, unspecified: Secondary | ICD-10-CM | POA: Insufficient documentation
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
## 2016-11-27 NOTE — Progress Notes (Signed)
Orthopedic Tech Progress Note Patient Details:  Colleen Bradley 1990/04/27 [id number]  Ortho Devices Type of Ortho Device: Finger splint Ortho Device/Splint Location: lue 4th finger Ortho Device/Splint Interventions: Ordered, Application   [name], [name] 11/27/2016, 2:06 AM
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
## 2016-11-27 NOTE — Discharge Instructions (Signed)
Recommend tylenol or motrin as needed for pain. Follow-up with your primary care doctor if any ongoing issues. Return here for any new/worsening symptoms.
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
## 2017-10-23 DIAGNOSIS — Z5181 Encounter for therapeutic drug level monitoring: Secondary | ICD-10-CM | POA: Diagnosis not present

## 2017-11-09 ENCOUNTER — Emergency Department (HOSPITAL_COMMUNITY): Admission: EM | Admit: 2017-11-09 | Discharge: 2017-11-09 | Discharge status: Against Medical Advice | Payer: Self-pay

## 2017-11-09 ENCOUNTER — Other Ambulatory Visit: Payer: Self-pay

## 2017-11-09 NOTE — ED Notes (Signed)
Called X 3 for triage with no answer received.

## 2018-04-09 ENCOUNTER — Encounter (HOSPITAL_BASED_OUTPATIENT_CLINIC_OR_DEPARTMENT_OTHER): Payer: Self-pay | Admitting: Emergency Medicine

## 2018-04-09 ENCOUNTER — Emergency Department (HOSPITAL_BASED_OUTPATIENT_CLINIC_OR_DEPARTMENT_OTHER)
Admission: EM | Admit: 2018-04-09 | Discharge: 2018-04-09 | Disposition: A | Discharge status: Home / Self Care | Payer: Medicaid Other | Attending: Emergency Medicine | Admitting: Emergency Medicine

## 2018-04-09 ENCOUNTER — Other Ambulatory Visit: Payer: Self-pay

## 2018-04-09 DIAGNOSIS — H5789 Other specified disorders of eye and adnexa: Secondary | ICD-10-CM | POA: Diagnosis present

## 2018-04-09 DIAGNOSIS — Z7984 Long term (current) use of oral hypoglycemic drugs: Secondary | ICD-10-CM | POA: Diagnosis not present

## 2018-04-09 DIAGNOSIS — Z79899 Other long term (current) drug therapy: Secondary | ICD-10-CM | POA: Insufficient documentation

## 2018-04-09 DIAGNOSIS — H1033 Unspecified acute conjunctivitis, bilateral: Secondary | ICD-10-CM | POA: Diagnosis not present

## 2018-04-09 DIAGNOSIS — Z87891 Personal history of nicotine dependence: Secondary | ICD-10-CM | POA: Insufficient documentation

## 2018-04-09 DIAGNOSIS — I10 Essential (primary) hypertension: Secondary | ICD-10-CM | POA: Insufficient documentation

## 2018-04-09 MED ORDER — HYDROCODONE-ACETAMINOPHEN 5-325 MG PO TABS: 1.0000 | ORAL_TABLET | Freq: Four times a day (QID) | ORAL | 0 refills | Status: DC | PRN

## 2018-04-09 MED ORDER — BESIFLOXACIN HCL 0.6 % OP SUSP: 1.0000 [drp] | OPHTHALMIC | 0 refills | Status: DC

## 2018-04-09 MED ORDER — FLUORESCEIN SODIUM 1 MG OP STRP
1.0000 | ORAL_STRIP | Freq: Once | OPHTHALMIC | Status: DC
  Filled 2018-04-09: qty 1

## 2018-04-09 MED ORDER — IBUPROFEN 600 MG PO TABS: 600.0000 mg | ORAL_TABLET | Freq: Four times a day (QID) | ORAL | 0 refills | Status: DC | PRN

## 2018-04-09 MED ORDER — TETRACAINE HCL 0.5 % OP SOLN
2.0000 [drp] | Freq: Once | OPHTHALMIC | Status: DC
  Filled 2018-04-09: qty 4

## 2018-04-09 MED FILL — IBUPROFEN 600 MG TABLET: 600 | 7 days supply | Qty: 30 | Fill #0

## 2018-04-09 MED FILL — BESIVANCE 0.6% SUSP: 0.6 | 17 days supply | Qty: 5 | Fill #0

## 2018-04-09 MED FILL — HYDROCODON-APAP 5-325: 5-325 | 2 days supply | Qty: 10 | Fill #0

## 2018-04-09 NOTE — Discharge Instructions (Addendum)
Use Besivance every 2 hours until you see Dr. Sherryll BurgerShah on Friday at 10 AM.  Take 1-2 Norco every 6 hours as needed for severe pain.  You can alternate with ibuprofen as prescribed as well.  Please return to the emergency department if you develop any new or worsening symptoms.  Do not drink alcohol, drive, operate machinery or participate in any other potentially dangerous activities while taking opiate pain medication as it may make you sleepy. Do not take this medication with any other sedating medications, either prescription or over-the-counter. If you were prescribed Percocet or Vicodin, do not take these with acetaminophen (Tylenol) as it is already contained within these medications and overdose of Tylenol is dangerous.   This medication is an opiate (or narcotic) pain medication and can be habit forming.  Use it as little as possible to achieve adequate pain control.  Do not use or use it with extreme caution if you have a history of opiate abuse or dependence. This medication is intended for your use only - do not give any to anyone else and keep it in a secure place where nobody else, especially children, have access to it. It will also cause or worsen constipation, so you may want to consider taking an over-the-counter stool softener while you are taking this medication.

## 2018-04-09 NOTE — ED Triage Notes (Signed)
Pt presents with c/o bilateral eye irritation from sleeping with contacts in over night. Pt rinsed her eyes at home with out relief.

## 2018-04-10 NOTE — ED Provider Notes (Signed)
[facility] EMERGENCY DEPARTMENT Provider Note   CSN: [id number] Arrival date & time: 04/09/18  1049     History   Chief Complaint Chief Complaint  Patient presents with  . Eye Problem    HPI Colleen Bradley is a 27 y.o. female with history of obesity, hypertension, PCOS who presents with bilateral eye pain, redness, and blurred vision after sleeping and cosmetic contact lenses last evening.  Patient reports she has never worn contact lenses before and did not know units was to sleep and then.  She does not wear glasses or prescribed contact lenses.  She tried putting Visine in her eyes which did not help.  HPI  Past Medical History:  Diagnosis Date  . Hypertension   . Obesity   . PCOS (polycystic ovarian syndrome)     Patient Active Problem List   Diagnosis Date Noted  . POLYCYSTIC OVARY 06/14/2006  . OBESITY, NOS 06/14/2006  . HYPERTENSION, BENIGN SYSTEMIC 06/14/2006  . METRORRHAGIA 06/14/2006  . ACNE 06/14/2006    Past Surgical History:  Procedure Laterality Date  . arm surgery Left   . TONSILLECTOMY       OB History    Gravida  1   Para  0   Term      Preterm      AB      Living  0     SAB      TAB      Ectopic      Multiple      Live Births               Home Medications    Prior to Admission medications   Medication Sig Start Date End Date Taking? Authorizing Provider  Besifloxacin HCl (BESIVANCE) 0.6 % SUSP Apply 1 drop to eye every 2 (two) hours. 04/09/18   Drayven Marchena, Waylan BogaAlexandra M, PA-C  cephALEXin (KEFLEX) 500 MG capsule Take 1 capsule (500 mg total) by mouth 2 (two) times daily. 05/06/16   Juanita Streight, Waylan BogaAlexandra M, PA-C  HYDROcodone-acetaminophen (NORCO/VICODIN) 5-325 MG tablet Take 1-2 tablets by mouth every 6 (six) hours as needed. 04/09/18   Kimber Fritts, Waylan BogaAlexandra M, PA-C  ibuprofen (ADVIL,MOTRIN) 600 MG tablet Take 1 tablet (600 mg total) by mouth every 6 (six) hours as needed. 04/09/18   Cherye Gaertner, Waylan BogaAlexandra M, PA-C  metFORMIN (GLUCOPHAGE) 500  MG tablet Take 1 tablet by mouth once a day 06/16/15   [provider]  metroNIDAZOLE (FLAGYL) 500 MG tablet Take 1 tablet (500 mg total) by mouth 2 (two) times daily. 05/06/16   Reighn Kaplan, Waylan BogaAlexandra M, PA-C  oxyCODONE-acetaminophen (PERCOCET) 10-325 MG tablet Take 1 tablet by mouth 3 (three) times daily as needed. for pain 04/08/16   [provider]    Family History Family History  Problem Relation Age of Onset  . Hypertension Mother   . Hypertension Father   . Diabetes Father   . Diabetes Brother     Social History Social History   Tobacco Use  . Smoking status: Former Smoker    Types: Cigarettes  . Smokeless tobacco: Never Used  Substance Use Topics  . Alcohol use: No  . Drug use: No     Allergies   Penicillins   Review of Systems Review of Systems  Constitutional: Negative for fever.  Eyes: Positive for photophobia, pain, redness and visual disturbance.     Physical Exam Updated Vital Signs BP (!) 160/86 (BP Location: Right Arm)   Pulse 89   Temp 98.6  F (37 C) (Oral)   Resp 18   Ht 5\' 7"  (1.702 m)   Wt 113.4 kg   LMP 04/04/2018   SpO2 100%   BMI 39.16 kg/m   Physical Exam Vitals signs and nursing note reviewed.  Constitutional:      General: She is not in acute distress.    Appearance: She is well-developed. She is not diaphoretic.  HENT:     Head: Normocephalic and atraumatic.     Mouth/Throat:     Pharynx: No oropharyngeal exudate.  Eyes:     General: Lids are normal. Lids are everted, no foreign bodies appreciated. No scleral icterus.       Right eye: No discharge.        Left eye: No discharge.     Extraocular Movements: Extraocular movements intact.     Conjunctiva/sclera:     Right eye: Right conjunctiva is injected.     Left eye: Left conjunctiva is injected.     Pupils: Pupils are equal, round, and reactive to light.     Comments:   Visual Acuity  Right Eye Distance: 20/100 Left Eye Distance: 20/200 Bilateral  Distance: 20/100 No uptake on fluorescein staining bilaterally, no infiltrate or abrasion noted Consensual photophobia in the left eye with shining in the right eye, but no consensual photophobia in the right eye Photophobia bilaterally, left greater than right Mild to moderate conjunctival injection Tono-Pen pressures averaging 25 bilaterally  Neck:     Musculoskeletal: Normal range of motion and neck supple.     Thyroid: No thyromegaly.  Cardiovascular:     Rate and Rhythm: Normal rate and regular rhythm.     Heart sounds: Normal heart sounds. No murmur. No friction rub. No gallop.   Pulmonary:     Effort: Pulmonary effort is normal. No respiratory distress.     Breath sounds: Normal breath sounds. No stridor. No wheezing or rales.  Abdominal:     General: Bowel sounds are normal. There is no distension.     Palpations: Abdomen is soft.     Tenderness: There is no abdominal tenderness. There is no guarding or rebound.  Lymphadenopathy:     Cervical: No cervical adenopathy.  Skin:    General: Skin is warm and dry.     Coloration: Skin is not pale.     Findings: No rash.  Neurological:     Mental Status: She is alert.     Coordination: Coordination normal.      ED Treatments / Results  Labs (all labs ordered are listed, but only abnormal results are displayed) Labs Reviewed - No data to display  EKG None  Radiology No results found.  Procedures Procedures (including critical care time)  Medications Ordered in ED Medications - No data to display   Initial Impression / Assessment and Plan / ED Course  I have reviewed the triage vital signs and the nursing notes.  Pertinent labs & imaging results that were available during my care of the patient were reviewed by me and considered in my medical decision making (see chart for details).     Patient presenting with suspected infection after sleeping in cosmetic contact lenses.  There is no uptake on fluorescein  staining.  I discussed patient case with Dr. Sherryll BurgerShah, on-call for ophthalmology, who advised Besivance every 2 hours and follow-up in his office on Friday, 04/12/2018 at 10 AM.  Patient will be discharged home with ibuprofen and short course of Norco for pain control as  well.  Patient advised not to put contact lenses back in her eyes.  Return precautions discussed.  Patient understands and agrees with plan.  Patient vital stable throughout ED course and discharged in satisfactory condition.  I reviewed the [place] narcotic database and found no discrepancies.  Patient's blood pressure elevated in ED, however patient in significant pain throughout ED course.  Final Clinical Impressions(s) / ED Diagnoses   Final diagnoses:  Acute bacterial conjunctivitis of both eyes    ED Discharge Orders         Ordered    Besifloxacin HCl (BESIVANCE) 0.6 % SUSP  Every 2 hours     04/09/18 1341    HYDROcodone-acetaminophen (NORCO/VICODIN) 5-325 MG tablet  Every 6 hours PRN     04/09/18 1341    ibuprofen (ADVIL,MOTRIN) 600 MG tablet  Every 6 hours PRN     04/09/18 1341           Raechelle Sarti, Waylan Boga, PA-C 04/10/18 1224    Doug Sou, MD 04/10/18 1623

## 2018-05-27 ENCOUNTER — Other Ambulatory Visit: Payer: Self-pay

## 2018-05-27 ENCOUNTER — Encounter (HOSPITAL_COMMUNITY): Payer: Self-pay

## 2018-05-27 ENCOUNTER — Emergency Department (HOSPITAL_COMMUNITY)
Admission: EM | Admit: 2018-05-27 | Discharge: 2018-05-27 | Disposition: A | Discharge status: Home / Self Care | Payer: Medicaid Other | Attending: Emergency Medicine | Admitting: Emergency Medicine

## 2018-05-27 DIAGNOSIS — Z5321 Procedure and treatment not carried out due to patient leaving prior to being seen by health care provider: Secondary | ICD-10-CM | POA: Diagnosis not present

## 2018-05-27 DIAGNOSIS — R000 Tachycardia, unspecified: Secondary | ICD-10-CM | POA: Diagnosis not present

## 2018-05-27 DIAGNOSIS — R457 State of emotional shock and stress, unspecified: Secondary | ICD-10-CM | POA: Diagnosis not present

## 2018-05-27 DIAGNOSIS — I10 Essential (primary) hypertension: Secondary | ICD-10-CM | POA: Diagnosis not present

## 2018-05-27 DIAGNOSIS — M542 Cervicalgia: Secondary | ICD-10-CM | POA: Insufficient documentation

## 2018-05-27 DIAGNOSIS — R079 Chest pain, unspecified: Secondary | ICD-10-CM | POA: Diagnosis not present
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
## 2018-07-19 ENCOUNTER — Emergency Department (HOSPITAL_BASED_OUTPATIENT_CLINIC_OR_DEPARTMENT_OTHER)
Admission: EM | Admit: 2018-07-19 | Discharge: 2018-07-19 | Disposition: A | Discharge status: Home / Self Care | Payer: Medicaid Other | Attending: Emergency Medicine | Admitting: Emergency Medicine

## 2018-07-19 ENCOUNTER — Other Ambulatory Visit: Payer: Self-pay

## 2018-07-19 ENCOUNTER — Encounter (HOSPITAL_BASED_OUTPATIENT_CLINIC_OR_DEPARTMENT_OTHER): Payer: Self-pay | Admitting: *Deleted

## 2018-07-19 DIAGNOSIS — Z79899 Other long term (current) drug therapy: Secondary | ICD-10-CM | POA: Diagnosis not present

## 2018-07-19 DIAGNOSIS — N898 Other specified noninflammatory disorders of vagina: Secondary | ICD-10-CM | POA: Diagnosis not present

## 2018-07-19 DIAGNOSIS — F17210 Nicotine dependence, cigarettes, uncomplicated: Secondary | ICD-10-CM | POA: Insufficient documentation

## 2018-07-19 DIAGNOSIS — A599 Trichomoniasis, unspecified: Secondary | ICD-10-CM

## 2018-07-19 DIAGNOSIS — Z202 Contact with and (suspected) exposure to infections with a predominantly sexual mode of transmission: Secondary | ICD-10-CM | POA: Diagnosis not present

## 2018-07-19 DIAGNOSIS — Z7984 Long term (current) use of oral hypoglycemic drugs: Secondary | ICD-10-CM | POA: Insufficient documentation

## 2018-07-19 DIAGNOSIS — I10 Essential (primary) hypertension: Secondary | ICD-10-CM | POA: Diagnosis not present

## 2018-07-19 LAB — URINALYSIS, ROUTINE W REFLEX MICROSCOPIC
Bilirubin Urine: NEGATIVE
Glucose, UA: NEGATIVE mg/dL
Ketones, ur: NEGATIVE mg/dL
Leukocytes,Ua: NEGATIVE
Nitrite: NEGATIVE
Protein, ur: 30 mg/dL — AB
Specific Gravity, Urine: 1.015 (ref 1.005–1.030)
pH: 7.5 (ref 5.0–8.0)

## 2018-07-19 LAB — URINALYSIS, MICROSCOPIC (REFLEX)

## 2018-07-19 LAB — WET PREP, GENITAL
Sperm: NONE SEEN
Yeast Wet Prep HPF POC: NONE SEEN

## 2018-07-19 LAB — PREGNANCY, URINE: Preg Test, Ur: NEGATIVE

## 2018-07-19 MED ORDER — CEFTRIAXONE SODIUM 250 MG IJ SOLR
250.0000 mg | Freq: Once | INTRAMUSCULAR | Status: AC
  Administered 2018-07-19: 250 mg via INTRAMUSCULAR
  Filled 2018-07-19: qty 250

## 2018-07-19 MED ORDER — METRONIDAZOLE 500 MG PO TABS: 500.0000 mg | ORAL_TABLET | Freq: Two times a day (BID) | ORAL | 0 refills | Status: DC

## 2018-07-19 MED ORDER — AZITHROMYCIN 1 G PO PACK
1.0000 g | PACK | Freq: Once | ORAL | Status: AC
  Administered 2018-07-19: 1 g via ORAL
  Filled 2018-07-19: qty 1

## 2018-07-19 NOTE — Discharge Instructions (Signed)
You have been treated today for an STD.   We will also take antibiotics home to treat for trichomonas.  Take Flagyl as directed.  It is very important that you do not consume any alcohol while taking this medication as it will cause you to become violently ill.  The test results with take 2-3 days to return. If there is an abnormal result, you will be notified. If you do not hear anything, that means the results were negative. You can also log on MyChart to see the results.   Your sexual partner needs to be treated too. Do not have sexual intercourse for the next 7 days and after your partner has been treated.   Follow-up with your primary care doctor in 2-4 days. If you do not have a primary care doctor, you can use one listed in the paperwork.   Return to the Emergency Department for any fever, abdominal pain, difficulty breathing, nausea/vomiting or any other worsening or concerning symptoms.

## 2018-07-19 NOTE — ED Notes (Signed)
Assisted with pelvic ... Pt. Tolerated well.

## 2018-07-19 NOTE — ED Triage Notes (Signed)
STD check

## 2018-07-19 NOTE — ED Provider Notes (Signed)
[facility] EMERGENCY DEPARTMENT Provider Note   CSN: [id number] Arrival date & time: 07/19/18  1847    History   Chief Complaint No chief complaint on file.   HPI Colleen Bradley is a 28 y.o. female asthma history of hypertension, obesity, PCOS who presents for evaluation of possible STD exposure.  Patient states her boyfriend told her that was positive for trichomonas.  Patient states that she has not noticed any abnormal vaginal discharge.  She reports she always has some degree of discharge and has not noticed an increase.  She states that she is currently sexually active with one partner.  They do not use protection.  Her LMP was 05/20/2018.  Patient denies any fevers, abdominal pain, nausea/vomiting, vaginal bleeding, dysuria, hematuria.     The history is provided by the patient.    Past Medical History:  Diagnosis Date  . Hypertension   . Obesity   . PCOS (polycystic ovarian syndrome)     Patient Active Problem List   Diagnosis Date Noted  . POLYCYSTIC OVARY 06/14/2006  . OBESITY, NOS 06/14/2006  . HYPERTENSION, BENIGN SYSTEMIC 06/14/2006  . METRORRHAGIA 06/14/2006  . ACNE 06/14/2006    Past Surgical History:  Procedure Laterality Date  . arm surgery Left   . TONSILLECTOMY       OB History    Gravida  1   Para  0   Term      Preterm      AB      Living  0     SAB      TAB      Ectopic      Multiple      Live Births               Home Medications    Prior to Admission medications   Medication Sig Start Date End Date Taking? Authorizing Provider  Besifloxacin HCl (BESIVANCE) 0.6 % SUSP Apply 1 drop to eye every 2 (two) hours. 04/09/18   Law, Waylan Boga, PA-C  cephALEXin (KEFLEX) 500 MG capsule Take 1 capsule (500 mg total) by mouth 2 (two) times daily. 05/06/16   Law, Waylan Boga, PA-C  HYDROcodone-acetaminophen (NORCO/VICODIN) 5-325 MG tablet Take 1-2 tablets by mouth every 6 (six) hours as needed. 04/09/18   Law, Waylan Boga, PA-C  ibuprofen (ADVIL,MOTRIN) 600 MG tablet Take 1 tablet (600 mg total) by mouth every 6 (six) hours as needed. 04/09/18   Law, Waylan Boga, PA-C  metFORMIN (GLUCOPHAGE) 500 MG tablet Take 1 tablet by mouth once a day 06/16/15   [provider]  metroNIDAZOLE (FLAGYL) 500 MG tablet Take 1 tablet (500 mg total) by mouth 2 (two) times daily. 07/19/18   Maxwell Caul, PA-C  oxyCODONE-acetaminophen (PERCOCET) 10-325 MG tablet Take 1 tablet by mouth 3 (three) times daily as needed. for pain 04/08/16   [provider]    Family History Family History  Problem Relation Age of Onset  . Hypertension Mother   . Hypertension Father   . Diabetes Father   . Diabetes Brother     Social History Social History   Tobacco Use  . Smoking status: Current Every Day Smoker    Types: Cigarettes  . Smokeless tobacco: Never Used  Substance Use Topics  . Alcohol use: No  . Drug use: Yes    Types: Marijuana     Allergies   Penicillins   Review of Systems Review of Systems  Constitutional: Negative for fever.  Respiratory: Negative for cough.   Gastrointestinal: Negative for nausea and vomiting.  Genitourinary: Positive for vaginal discharge. Negative for dysuria and hematuria.  Neurological: Negative for headaches.  All other systems reviewed and are negative.    Physical Exam Updated Vital Signs BP 132/78 (BP Location: Right Arm)   Pulse 80   Temp 98.2 F (36.8 C) (Oral)   Resp 16   Ht 5\' 7"  (1.702 m)   Wt 108.9 kg   LMP 06/15/2018   SpO2 100%   BMI 37.59 kg/m   Physical Exam Vitals signs and nursing note reviewed. Exam conducted with a chaperone present.  Constitutional:      Appearance: She is well-developed.  HENT:     Head: Normocephalic and atraumatic.  Eyes:     General: No scleral icterus.       Right eye: No discharge.        Left eye: No discharge.     Conjunctiva/sclera: Conjunctivae normal.  Pulmonary:     Effort: Pulmonary effort is  normal.  Abdominal:     Tenderness: There is no abdominal tenderness.     Comments: Abdomen is soft, non-distended, non-tender. No rigidity, No guarding. No peritoneal signs.  Genitourinary:    Vagina: Vaginal discharge present.     Cervix: Erythema present. No cervical motion tenderness.     Adnexa: Right adnexa normal and left adnexa normal.       Right: No mass or tenderness.         Left: No mass or tenderness.       Comments: The exam was performed with a chaperone present. Normal external female genitalia. No lesions, rash, or sores. White discharge in vaginal vault.  Cervix is erythematous.  No CMT.  No right adnexal mass or tenderness bilaterally. Skin:    General: Skin is warm and dry.  Neurological:     Mental Status: She is alert.  Psychiatric:        Speech: Speech normal.        Behavior: Behavior normal.      ED Treatments / Results  Labs (all labs ordered are listed, but only abnormal results are displayed) Labs Reviewed  WET PREP, GENITAL - Abnormal; Notable for the following components:      Result Value   Trich, Wet Prep PRESENT (*)    Clue Cells Wet Prep HPF POC PRESENT (*)    WBC, Wet Prep HPF POC MODERATE (*)    All other components within normal limits  URINALYSIS, ROUTINE W REFLEX MICROSCOPIC - Abnormal; Notable for the following components:   APPearance HAZY (*)    Hgb urine dipstick SMALL (*)    Protein, ur 30 (*)    All other components within normal limits  URINALYSIS, MICROSCOPIC (REFLEX) - Abnormal; Notable for the following components:   Bacteria, UA RARE (*)    All other components within normal limits  PREGNANCY, URINE  GC/CHLAMYDIA PROBE AMP ([facility]) NOT AT [facility]    EKG None  Radiology No results found.  Procedures Procedures (including critical care time)  Medications Ordered in ED Medications  cefTRIAXone (ROCEPHIN) injection 250 mg (250 mg Intramuscular Given 07/19/18 2007)  azithromycin (ZITHROMAX) powder 1 g (1 g Oral  Given 07/19/18 2007)     Initial Impression / Assessment and Plan / ED Course  I have reviewed the triage vital signs and the nursing notes.  Pertinent labs & imaging results that were available during my care of the patient were reviewed by  me and considered in my medical decision making (see chart for details).        28 year old female who presents for wash evaluation for possible STD exposure.  She reports she is currently sexually active with one partner.  She reports she normally has some vaginal discharge.  No fevers, abdominal pain, nausea/vomiting. Patient is afebrile, non-toxic appearing, sitting comfortably on examination table. Vital signs reviewed and stable.  Abdominal exam benign.  Consider STD.  Pelvic exam as documented above.  Patient with no CMT.  Exam not concerning for PID.  She did have some cervical erythema as well as some discharge in the vaginal vault.  Wet prep positive for trichomoniasis, clue cells.  UA shows small amount of hemoglobin, no nitrites, leukocytes.  Urine pregnancy negative.  Discussed results with patient.  Patient is allergic to penicillins.  She has tolerated ceftriaxone in the past.  No other allergies.  Patient wishes to go on to be treated for gonorrhea/chlamydia today.  Will plan to treat for trichomonas.  Encourage safe sex practices. At this time, patient exhibits no emergent life-threatening condition that require further evaluation in ED or admission. Patient had ample opportunity for questions and discussion. All patient's questions were answered with full understanding. Strict return precautions discussed. Patient expresses understanding and agreement to plan.   Portions of this note were generated with Scientist, clinical (histocompatibility and immunogenetics). Dictation errors may occur despite best attempts at proofreading.     Final Clinical Impressions(s) / ED Diagnoses   Final diagnoses:  Trichomoniasis    ED Discharge Orders         Ordered     metroNIDAZOLE (FLAGYL) 500 MG tablet  2 times daily     07/19/18 2001           Rosana Hoes 07/19/18 2149    Rolan Bucco, MD 07/19/18 2210

## 2018-07-22 LAB — GC/CHLAMYDIA PROBE AMP (~~LOC~~) NOT AT ARMC
Chlamydia: POSITIVE — AB
Neisseria Gonorrhea: NEGATIVE

## 2018-10-07 DIAGNOSIS — Z5181 Encounter for therapeutic drug level monitoring: Secondary | ICD-10-CM | POA: Diagnosis not present

## 2018-10-09 DIAGNOSIS — Z5181 Encounter for therapeutic drug level monitoring: Secondary | ICD-10-CM | POA: Diagnosis not present

## 2018-10-21 DIAGNOSIS — Z5181 Encounter for therapeutic drug level monitoring: Secondary | ICD-10-CM | POA: Diagnosis not present

## 2018-10-23 DIAGNOSIS — Z5181 Encounter for therapeutic drug level monitoring: Secondary | ICD-10-CM | POA: Diagnosis not present

## 2018-10-28 DIAGNOSIS — Z5181 Encounter for therapeutic drug level monitoring: Secondary | ICD-10-CM | POA: Diagnosis not present

## 2018-10-30 DIAGNOSIS — Z5181 Encounter for therapeutic drug level monitoring: Secondary | ICD-10-CM | POA: Diagnosis not present

## 2018-11-04 DIAGNOSIS — Z5181 Encounter for therapeutic drug level monitoring: Secondary | ICD-10-CM | POA: Diagnosis not present

## 2018-11-06 DIAGNOSIS — Z5181 Encounter for therapeutic drug level monitoring: Secondary | ICD-10-CM | POA: Diagnosis not present

## 2018-11-11 DIAGNOSIS — Z5181 Encounter for therapeutic drug level monitoring: Secondary | ICD-10-CM | POA: Diagnosis not present

## 2018-11-13 DIAGNOSIS — Z5181 Encounter for therapeutic drug level monitoring: Secondary | ICD-10-CM | POA: Diagnosis not present

## 2018-11-25 DIAGNOSIS — Z5181 Encounter for therapeutic drug level monitoring: Secondary | ICD-10-CM | POA: Diagnosis not present

## 2018-11-26 ENCOUNTER — Encounter (HOSPITAL_BASED_OUTPATIENT_CLINIC_OR_DEPARTMENT_OTHER): Payer: Self-pay | Admitting: *Deleted

## 2018-11-26 ENCOUNTER — Emergency Department (HOSPITAL_BASED_OUTPATIENT_CLINIC_OR_DEPARTMENT_OTHER): Payer: Medicaid Other

## 2018-11-26 ENCOUNTER — Other Ambulatory Visit: Payer: Self-pay

## 2018-11-26 ENCOUNTER — Emergency Department (HOSPITAL_BASED_OUTPATIENT_CLINIC_OR_DEPARTMENT_OTHER)
Admission: EM | Admit: 2018-11-26 | Discharge: 2018-11-26 | Disposition: A | Discharge status: Home / Self Care | Payer: Medicaid Other | Attending: Emergency Medicine | Admitting: Emergency Medicine

## 2018-11-26 DIAGNOSIS — I10 Essential (primary) hypertension: Secondary | ICD-10-CM | POA: Insufficient documentation

## 2018-11-26 DIAGNOSIS — A5901 Trichomonal vulvovaginitis: Secondary | ICD-10-CM | POA: Insufficient documentation

## 2018-11-26 DIAGNOSIS — Z79899 Other long term (current) drug therapy: Secondary | ICD-10-CM | POA: Diagnosis not present

## 2018-11-26 DIAGNOSIS — F17210 Nicotine dependence, cigarettes, uncomplicated: Secondary | ICD-10-CM | POA: Diagnosis not present

## 2018-11-26 DIAGNOSIS — R1032 Left lower quadrant pain: Secondary | ICD-10-CM | POA: Diagnosis present

## 2018-11-26 DIAGNOSIS — B9689 Other specified bacterial agents as the cause of diseases classified elsewhere: Secondary | ICD-10-CM | POA: Insufficient documentation

## 2018-11-26 DIAGNOSIS — N760 Acute vaginitis: Secondary | ICD-10-CM | POA: Insufficient documentation

## 2018-11-26 DIAGNOSIS — R102 Pelvic and perineal pain: Secondary | ICD-10-CM | POA: Diagnosis not present

## 2018-11-26 DIAGNOSIS — A599 Trichomoniasis, unspecified: Secondary | ICD-10-CM

## 2018-11-26 DIAGNOSIS — N72 Inflammatory disease of cervix uteri: Secondary | ICD-10-CM | POA: Insufficient documentation

## 2018-11-26 LAB — BASIC METABOLIC PANEL
Anion gap: 10 (ref 5–15)
BUN: 7 mg/dL (ref 6–20)
CO2: 25 mmol/L (ref 22–32)
Calcium: 8.8 mg/dL — ABNORMAL LOW (ref 8.9–10.3)
Chloride: 102 mmol/L (ref 98–111)
Creatinine, Ser: 1.02 mg/dL — ABNORMAL HIGH (ref 0.44–1.00)
GFR calc Af Amer: >60 mL/min (ref >60)
GFR calc non Af Amer: >60 mL/min (ref >60)
Glucose, Bld: 125 mg/dL — ABNORMAL HIGH (ref 70–99)
Potassium: 3.6 mmol/L (ref 3.5–5.1)
Sodium: 137 mmol/L (ref 135–145)

## 2018-11-26 LAB — CBC WITH DIFFERENTIAL/PLATELET
Abs Immature Granulocytes: 0.05 10*3/uL (ref 0.00–0.07)
Basophils Absolute: 0 10*3/uL (ref 0.0–0.1)
Basophils Relative: 0 %
Eosinophils Absolute: 0.2 10*3/uL (ref 0.0–0.5)
Eosinophils Relative: 1 %
HCT: 42.3 % (ref 36.0–46.0)
Hemoglobin: 13.5 g/dL (ref 12.0–15.0)
Immature Granulocytes: 0 %
Lymphocytes Relative: 20 %
Lymphs Abs: 2.5 10*3/uL (ref 0.7–4.0)
MCH: 29 pg (ref 26.0–34.0)
MCHC: 31.9 g/dL (ref 30.0–36.0)
MCV: 90.8 fL (ref 80.0–100.0)
Monocytes Absolute: 0.9 10*3/uL (ref 0.1–1.0)
Monocytes Relative: 7 %
Neutro Abs: 9 10*3/uL — ABNORMAL HIGH (ref 1.7–7.7)
Neutrophils Relative %: 72 %
Platelets: 344 10*3/uL (ref 150–400)
RBC: 4.66 MIL/uL (ref 3.87–5.11)
RDW: 13 % (ref 11.5–15.5)
WBC: 12.7 10*3/uL — ABNORMAL HIGH (ref 4.0–10.5)
nRBC: 0 % (ref 0.0–0.2)

## 2018-11-26 LAB — URINALYSIS, ROUTINE W REFLEX MICROSCOPIC
Bilirubin Urine: NEGATIVE
Glucose, UA: NEGATIVE mg/dL
Ketones, ur: NEGATIVE mg/dL
Nitrite: NEGATIVE
Protein, ur: 30 mg/dL — AB
Specific Gravity, Urine: >1.03 — ABNORMAL HIGH (ref 1.005–1.030)
pH: 6 (ref 5.0–8.0)

## 2018-11-26 LAB — WET PREP, GENITAL
Sperm: NONE SEEN
Yeast Wet Prep HPF POC: NONE SEEN

## 2018-11-26 LAB — URINALYSIS, MICROSCOPIC (REFLEX)

## 2018-11-26 LAB — PREGNANCY, URINE: Preg Test, Ur: NEGATIVE

## 2018-11-26 MED ORDER — AZITHROMYCIN 250 MG PO TABS
2000.0000 mg | ORAL_TABLET | Freq: Once | ORAL | Status: AC
  Administered 2018-11-26: 2000 mg via ORAL
  Filled 2018-11-26: qty 8

## 2018-11-26 MED ORDER — METRONIDAZOLE 500 MG PO TABS
500.0000 mg | ORAL_TABLET | Freq: Once | ORAL | Status: AC
  Administered 2018-11-26: 500 mg via ORAL
  Filled 2018-11-26: qty 1

## 2018-11-26 MED ORDER — METRONIDAZOLE 500 MG PO TABS: 500.0000 mg | ORAL_TABLET | Freq: Two times a day (BID) | ORAL | 0 refills | Status: DC

## 2018-11-26 MED ORDER — DOXYCYCLINE HYCLATE 100 MG PO CAPS: 100.0000 mg | ORAL_CAPSULE | Freq: Two times a day (BID) | ORAL | 0 refills | Status: DC

## 2018-11-26 NOTE — ED Notes (Signed)
Medications administered explained, prescriptions reviewed. Pt voiced understanding

## 2018-11-26 NOTE — Discharge Instructions (Signed)
Take the antibiotics as prescribed.  Return for new worsening symptoms.  You will be called for your syphilis, chlamydia, HIV and area testing.  Follow-up with health department or PCP if testing positive.

## 2018-11-26 NOTE — ED Provider Notes (Signed)
[facility] EMERGENCY DEPARTMENT Provider Note   CSN: [id number] Arrival date & time: 11/26/18  1406  History   Chief Complaint Chief Complaint  Patient presents with  . Abdominal Pain  . Vaginal Bleeding   HPI Colleen Bradley is a 28 y.o. female with past medical history significant for hypertension, obesity, PCOS who presents for evaluation of abdominal pain and vaginal bleeding.  Patient states that she has developed left lower abdominal cramping and suprapubic pain over the last 2 days.  Patient states she has had intermittent vaginal spotting.  Has had to wear a panty liner over the last 4 days.  States she changes this every 4-5 hours.  Patient states she is sexually active and does not use protection.  Has had thin white vaginal discharge over the last week without odor.  Describes her abdominal pain as cramping.  Denies fever, chills, nausea, vomiting, dysuria, hematuria, constipation or diarrhea.  Does not take anything for pain.  She rates her cramping a 4/10.  Denies radiation.  History obtained from patient and past medical records.  No interpreter was used.     HPI  Past Medical History:  Diagnosis Date  . Hypertension   . Obesity   . PCOS (polycystic ovarian syndrome)     Patient Active Problem List   Diagnosis Date Noted  . POLYCYSTIC OVARY 06/14/2006  . OBESITY, NOS 06/14/2006  . HYPERTENSION, BENIGN SYSTEMIC 06/14/2006  . METRORRHAGIA 06/14/2006  . ACNE 06/14/2006    Past Surgical History:  Procedure Laterality Date  . arm surgery Left   . TONSILLECTOMY       OB History    Gravida  1   Para  0   Term      Preterm      AB      Living  0     SAB      TAB      Ectopic      Multiple      Live Births               Home Medications    Prior to Admission medications   Medication Sig Start Date End Date Taking? Authorizing Provider  Besifloxacin HCl (BESIVANCE) 0.6 % SUSP Apply 1 drop to eye every 2 (two) hours. 04/09/18    Law, Waylan BogaAlexandra M, PA-C  cephALEXin (KEFLEX) 500 MG capsule Take 1 capsule (500 mg total) by mouth 2 (two) times daily. 05/06/16   Law, Waylan BogaAlexandra M, PA-C  doxycycline (VIBRAMYCIN) 100 MG capsule Take 1 capsule (100 mg total) by mouth 2 (two) times daily for 10 days. 11/26/18 12/06/18  Bayli Quesinberry A, PA-C  HYDROcodone-acetaminophen (NORCO/VICODIN) 5-325 MG tablet Take 1-2 tablets by mouth every 6 (six) hours as needed. 04/09/18   Law, Waylan BogaAlexandra M, PA-C  ibuprofen (ADVIL,MOTRIN) 600 MG tablet Take 1 tablet (600 mg total) by mouth every 6 (six) hours as needed. 04/09/18   Emi HolesLaw, Alexandra M, PA-C  metFORMIN (GLUCOPHAGE) 500 MG tablet Take 1 tablet by mouth once a day 06/16/15   [provider]  metroNIDAZOLE (FLAGYL) 500 MG tablet Take 1 tablet (500 mg total) by mouth 2 (two) times daily for 10 days. 11/26/18 12/06/18  Ary Lavine A, PA-C  oxyCODONE-acetaminophen (PERCOCET) 10-325 MG tablet Take 1 tablet by mouth 3 (three) times daily as needed. for pain 04/08/16   [provider]    Family History Family History  Problem Relation Age of Onset  . Hypertension Mother   .  Hypertension Father   . Diabetes Father   . Diabetes Brother     Social History Social History   Tobacco Use  . Smoking status: Current Every Day Smoker    Types: Cigarettes  . Smokeless tobacco: Never Used  Substance Use Topics  . Alcohol use: No  . Drug use: Yes    Types: Marijuana     Allergies   Penicillins   Review of Systems Review of Systems  Constitutional: Negative.   HENT: Negative.   Respiratory: Negative.   Cardiovascular: Negative.   Gastrointestinal: Positive for abdominal pain. Negative for anal bleeding, blood in stool, constipation, diarrhea, nausea, rectal pain and vomiting.  Genitourinary: Positive for menstrual problem, vaginal bleeding and vaginal discharge. Negative for decreased urine volume, difficulty urinating, dysuria, flank pain, frequency, genital sores,  hematuria, pelvic pain, urgency and vaginal pain.  Musculoskeletal: Negative.   Skin: Negative.   Neurological: Negative.   All other systems reviewed and are negative.    Physical Exam Updated Vital Signs BP 132/87   Pulse (!) 104   Temp 98.7 F (37.1 C) (Oral)   Resp 20   Ht 5\' 7"  (1.702 m)   Wt 108.9 kg   SpO2 98%   BMI 37.59 kg/m   Physical Exam Vitals signs and nursing note reviewed. Exam conducted with a chaperone present.  Constitutional:      General: She is not in acute distress.    Appearance: She is well-developed. She is obese. She is not ill-appearing, toxic-appearing or diaphoretic.  HENT:     Head: Normocephalic and atraumatic.     Mouth/Throat:     Mouth: Mucous membranes are moist.     Pharynx: Oropharynx is clear.  Eyes:     Pupils: Pupils are equal, round, and reactive to light.  Neck:     Musculoskeletal: Normal range of motion.  Cardiovascular:     Rate and Rhythm: Normal rate.     Heart sounds: Normal heart sounds.  Pulmonary:     Effort: Pulmonary effort is normal. No respiratory distress.     Breath sounds: Normal breath sounds.  Abdominal:     General: There is no distension.     Comments: Soft without rebound or guarding.  Mild tenderness to suprapubic and left pelvic area.  No abdominal wall herniations or skin changes.  Genitourinary:    Comments: Normal appearing external female genitalia without rashes or lesions, normal vaginal epithelium. Normal appearing cervix with moderate yellow discharge. No cervical petechiae. Cervical os is closed. There is no bleeding noted at the os. No Odor. Bimanual: Mild CMT,  nontender.  No palpable adnexal masses or tenderness. Uterus midline and not fixed. Rectovaginal exam was deferred.  No cystocele or rectocele noted. No pelvic lymphadenopathy noted. Wet prep was obtained.  Cultures for gonorrhea and chlamydia collected. Exam performed with chaperone in room. Musculoskeletal: Normal range of motion.   Skin:    General: Skin is warm and dry.  Neurological:     Mental Status: She is alert.    ED Treatments / Results  Labs (all labs ordered are listed, but only abnormal results are displayed) Labs Reviewed  WET PREP, GENITAL - Abnormal; Notable for the following components:      Result Value   Trich, Wet Prep PRESENT (*)    Clue Cells Wet Prep HPF POC PRESENT (*)    WBC, Wet Prep HPF POC MANY (*)    All other components within normal limits  URINALYSIS, ROUTINE W REFLEX  MICROSCOPIC - Abnormal; Notable for the following components:   APPearance CLOUDY (*)    Specific Gravity, Urine >1.030 (*)    Hgb urine dipstick LARGE (*)    Protein, ur 30 (*)    Leukocytes,Ua MODERATE (*)    All other components within normal limits  CBC WITH DIFFERENTIAL/PLATELET - Abnormal; Notable for the following components:   WBC 12.7 (*)    Neutro Abs 9.0 (*)    All other components within normal limits  BASIC METABOLIC PANEL - Abnormal; Notable for the following components:   Glucose, Bld 125 (*)    Creatinine, Ser 1.02 (*)    Calcium 8.8 (*)    All other components within normal limits  URINALYSIS, MICROSCOPIC (REFLEX) - Abnormal; Notable for the following components:   Bacteria, UA MANY (*)    All other components within normal limits  URINE CULTURE  PREGNANCY, URINE  RPR  HIV ANTIBODY (ROUTINE TESTING W REFLEX)  GC/CHLAMYDIA PROBE AMP ([facility]) NOT AT [facility]    EKG None  Radiology US Transvaginal Non-ob  Result Date: 11/26/2018 CLINICAL DATA:  Left pelvic pain for 2 days. EXAM: TRANSABDOMINAL AND TRANSVAGINAL ULTRASOUND OF PELVIS DOPPLER ULTRASOUND OF OVARIES TECHNIQUE: Both transabdominal and transvaginal ultrasound examinations of the pelvis were performed. Transabdominal technique was performed for global imaging of the pelvis including uterus, ovaries, adnexal regions, and pelvic cul-de-sac. It was necessary to proceed with endovaginal exam following the transabdominal exam to  visualize the endometrium and ovaries. Color and duplex Doppler ultrasound was utilized to evaluate blood flow to the ovaries. COMPARISON:  None. FINDINGS: Uterus Measurements: 7.0 x 3.2 x 3.6 cm = volume: 43 mL. No fibroids or other mass visualized. Endometrium Thickness: 6 mm.  No focal abnormality visualized. Right ovary Measurements: Approximately 2.7 x 1.6 x 2.5 cm = volume: 5.5 mL. Not well visualized. No gross mass. Left ovary Measurements: 3.2 x 2.5 x 2.4 cm = volume: 10 mL. Normal appearance/no adnexal mass. Pulsed Doppler evaluation of both ovaries demonstrates normal low-resistance arterial and venous waveforms within the left ovary. The right ovary was poorly evaluated. There was the suggestion of low level blood flow within the right ovary on spectral Doppler imaging, however blood flow cannot be established with certainty on this examination. Other findings No abnormal free fluid. IMPRESSION: 1. Poor visualization of the right ovary with limited assessment for the presence of blood flow. No right ovarian enlargement to suggest edema or mass. 2. Unremarkable appearance of the left ovary and uterus. Electronically Signed   By: Logan Bores M.D.   On: 11/26/2018 17:52   US Pelvis Complete  Result Date: 11/26/2018 CLINICAL DATA:  Left pelvic pain for 2 days. EXAM: TRANSABDOMINAL AND TRANSVAGINAL ULTRASOUND OF PELVIS DOPPLER ULTRASOUND OF OVARIES TECHNIQUE: Both transabdominal and transvaginal ultrasound examinations of the pelvis were performed. Transabdominal technique was performed for global imaging of the pelvis including uterus, ovaries, adnexal regions, and pelvic cul-de-sac. It was necessary to proceed with endovaginal exam following the transabdominal exam to visualize the endometrium and ovaries. Color and duplex Doppler ultrasound was utilized to evaluate blood flow to the ovaries. COMPARISON:  None. FINDINGS: Uterus Measurements: 7.0 x 3.2 x 3.6 cm = volume: 43 mL. No fibroids or other mass  visualized. Endometrium Thickness: 6 mm.  No focal abnormality visualized. Right ovary Measurements: Approximately 2.7 x 1.6 x 2.5 cm = volume: 5.5 mL. Not well visualized. No gross mass. Left ovary Measurements: 3.2 x 2.5 x 2.4 cm = volume: 10 mL. Normal  appearance/no adnexal mass. Pulsed Doppler evaluation of both ovaries demonstrates normal low-resistance arterial and venous waveforms within the left ovary. The right ovary was poorly evaluated. There was the suggestion of low level blood flow within the right ovary on spectral Doppler imaging, however blood flow cannot be established with certainty on this examination. Other findings No abnormal free fluid. IMPRESSION: 1. Poor visualization of the right ovary with limited assessment for the presence of blood flow. No right ovarian enlargement to suggest edema or mass. 2. Unremarkable appearance of the left ovary and uterus. Electronically Signed   By: Sebastian AcheAllen  Grady M.D.   On: 11/26/2018 17:52   [place] Art/ven Flow Abd Pelv Doppler  Result Date: 11/26/2018 CLINICAL DATA:  Left pelvic pain for 2 days. EXAM: TRANSABDOMINAL AND TRANSVAGINAL ULTRASOUND OF PELVIS DOPPLER ULTRASOUND OF OVARIES TECHNIQUE: Both transabdominal and transvaginal ultrasound examinations of the pelvis were performed. Transabdominal technique was performed for global imaging of the pelvis including uterus, ovaries, adnexal regions, and pelvic cul-de-sac. It was necessary to proceed with endovaginal exam following the transabdominal exam to visualize the endometrium and ovaries. Color and duplex Doppler ultrasound was utilized to evaluate blood flow to the ovaries. COMPARISON:  None. FINDINGS: Uterus Measurements: 7.0 x 3.2 x 3.6 cm = volume: 43 mL. No fibroids or other mass visualized. Endometrium Thickness: 6 mm.  No focal abnormality visualized. Right ovary Measurements: Approximately 2.7 x 1.6 x 2.5 cm = volume: 5.5 mL. Not well visualized. No gross mass. Left ovary Measurements: 3.2 x 2.5 x  2.4 cm = volume: 10 mL. Normal appearance/no adnexal mass. Pulsed Doppler evaluation of both ovaries demonstrates normal low-resistance arterial and venous waveforms within the left ovary. The right ovary was poorly evaluated. There was the suggestion of low level blood flow within the right ovary on spectral Doppler imaging, however blood flow cannot be established with certainty on this examination. Other findings No abnormal free fluid. IMPRESSION: 1. Poor visualization of the right ovary with limited assessment for the presence of blood flow. No right ovarian enlargement to suggest edema or mass. 2. Unremarkable appearance of the left ovary and uterus. Electronically Signed   By: Sebastian AcheAllen  Grady M.D.   On: 11/26/2018 17:52    Procedures Procedures (including critical care time)  Medications Ordered in ED Medications  azithromycin (ZITHROMAX) tablet 2,000 mg (2,000 mg Oral Given 11/26/18 1753)  metroNIDAZOLE (FLAGYL) tablet 500 mg (500 mg Oral Given 11/26/18 1753)   Initial Impression / Assessment and Plan / ED Course  I have reviewed the triage vital signs and the nursing notes.  Pertinent labs & imaging results that were available during my care of the patient were reviewed by me and considered in my medical decision making (see chart for details).  28 year old female appears otherwise well presents for evaluation of left abdominal cramping and vaginal bleeding.  Sexually active with men and concern for STDs. Abd Soft, nontender without rebound or guarding.  GU exam with moderate yellow discharge in vaginal vault.  Minimal CMT, No adnexal tenderness.  No diarrhea to suggest diverticulitis.  CBC with mild leukocytosis at 12.7 Metabolic panel with elevated glucose at 125, no additional point, renal liver abnormality Wet prep with trichomoniasis, clue cells and many WBC GC/chlamydia/HIV/RPR pending. Urinalysis with moderate leuks, many bacteria, nitrate negative.  Culture sent.  Treating  cervicitis with doxycycline which should also cover for UTI.  Allergy to penicillins. Per up to date can give 2g Azithro in replacement for rocephin.  We will also give Flagyl  for BV and trichomoniasis.  Given mild CMT will place on Doxy to cover for cervicitis.  Reevaluation abdomen soft, nontender without rebound or guarding.  Ultrasound without evidence of torsion, TOA.  Bleeding possibly from trichomonas.  Pt understands that they have GC/Chlamydia cultures pending and that they will need to inform all sexual partners if results return positive. Pt has been treated prophylactically with azithromycin and Rocephin due to pts history, pelvic exam, and wet prep with increased WBCs.  Pt has also been treated with Flagyl for Bacterial Vaginosis. Pt has been advised to not drink alcohol while on this medication.  Patient to be discharged with instructions to follow up with OBGYN/PCP. Discussed importance of using protection when sexually active.   Patient is nontoxic, nonseptic appearing, in no apparent distress.  Patient's pain and other symptoms adequately managed in emergency department.  Fluid bolus given.  Labs, imaging and vitals reviewed.  Patient does not meet the SIRS or Sepsis criteria.  On repeat exam patient does not have a surgical abdomin and there are no peritoneal signs.  No indication of appendicitis, bowel obstruction, bowel perforation, cholecystitis, diverticulitis, PID or ectopic pregnancy.  Patient discharged home with symptomatic treatment and given strict instructions for follow-up with their primary care physician.  I have also discussed reasons to return immediately to the ER.  Patient expresses understanding and agrees with plan.      Final Clinical Impressions(s) / ED Diagnoses   Final diagnoses:  Cervicitis  Trichomonas infection  Bacterial vaginosis    ED Discharge Orders         Ordered    metroNIDAZOLE (FLAGYL) 500 MG tablet  2 times daily     11/26/18 1815     doxycycline (VIBRAMYCIN) 100 MG capsule  2 times daily     11/26/18 1815           Wilbert Hayashi A, PA-C 11/26/18 1816    Vanetta MuldersZackowski, Scott, MD 11/30/18 [phone]

## 2018-11-26 NOTE — ED Triage Notes (Signed)
Lower abdominal pain and vaginal bleeding. Menses have been irregular.

## 2018-11-27 DIAGNOSIS — Z5181 Encounter for therapeutic drug level monitoring: Secondary | ICD-10-CM | POA: Diagnosis not present

## 2018-11-27 LAB — RPR: RPR Ser Ql: NONREACTIVE

## 2018-11-27 LAB — HIV ANTIBODY (ROUTINE TESTING W REFLEX): HIV Screen 4th Generation wRfx: NONREACTIVE

## 2018-11-28 ENCOUNTER — Telehealth (HOSPITAL_BASED_OUTPATIENT_CLINIC_OR_DEPARTMENT_OTHER): Payer: Self-pay | Admitting: Emergency Medicine

## 2018-11-28 LAB — URINE CULTURE: Culture: 1000 — AB

## 2018-11-28 LAB — GC/CHLAMYDIA PROBE AMP (~~LOC~~) NOT AT ARMC
Chlamydia: POSITIVE — AB
Neisseria Gonorrhea: NEGATIVE

## 2018-11-28 MED ORDER — METRONIDAZOLE 500 MG PO TABS: 500.0000 mg | ORAL_TABLET | Freq: Two times a day (BID) | ORAL | 0 refills | Status: DC

## 2018-11-28 MED ORDER — DOXYCYCLINE HYCLATE 100 MG PO CAPS: 100.0000 mg | ORAL_CAPSULE | Freq: Two times a day (BID) | ORAL | 0 refills | Status: DC

## 2018-11-28 NOTE — Telephone Encounter (Signed)
Patient called reporting that her significant other threw away her antibiotics and requesting new rx. Chart reviewed. Will refill.

## 2018-11-29 ENCOUNTER — Telehealth: Payer: Self-pay

## 2018-11-29 NOTE — Telephone Encounter (Signed)
No abx needed for [facility] ED 11/26/2018 per Seth Bake [facility] Pharm D

## 2018-12-02 DIAGNOSIS — Z5181 Encounter for therapeutic drug level monitoring: Secondary | ICD-10-CM | POA: Diagnosis not present

## 2018-12-04 DIAGNOSIS — Z5181 Encounter for therapeutic drug level monitoring: Secondary | ICD-10-CM | POA: Diagnosis not present

## 2018-12-09 DIAGNOSIS — Z5181 Encounter for therapeutic drug level monitoring: Secondary | ICD-10-CM | POA: Diagnosis not present

## 2018-12-11 DIAGNOSIS — Z5181 Encounter for therapeutic drug level monitoring: Secondary | ICD-10-CM | POA: Diagnosis not present

## 2018-12-16 DIAGNOSIS — Z5181 Encounter for therapeutic drug level monitoring: Secondary | ICD-10-CM | POA: Diagnosis not present

## 2018-12-18 DIAGNOSIS — Z5181 Encounter for therapeutic drug level monitoring: Secondary | ICD-10-CM | POA: Diagnosis not present

## 2018-12-24 DIAGNOSIS — Z5181 Encounter for therapeutic drug level monitoring: Secondary | ICD-10-CM | POA: Diagnosis not present

## 2018-12-26 DIAGNOSIS — Z5181 Encounter for therapeutic drug level monitoring: Secondary | ICD-10-CM | POA: Diagnosis not present

## 2018-12-30 DIAGNOSIS — Z5181 Encounter for therapeutic drug level monitoring: Secondary | ICD-10-CM | POA: Diagnosis not present

## 2019-01-01 DIAGNOSIS — Z5181 Encounter for therapeutic drug level monitoring: Secondary | ICD-10-CM | POA: Diagnosis not present

## 2019-01-06 DIAGNOSIS — Z5181 Encounter for therapeutic drug level monitoring: Secondary | ICD-10-CM | POA: Diagnosis not present

## 2019-01-08 DIAGNOSIS — Z5181 Encounter for therapeutic drug level monitoring: Secondary | ICD-10-CM | POA: Diagnosis not present

## 2019-01-13 DIAGNOSIS — Z5181 Encounter for therapeutic drug level monitoring: Secondary | ICD-10-CM | POA: Diagnosis not present

## 2019-01-15 DIAGNOSIS — Z5181 Encounter for therapeutic drug level monitoring: Secondary | ICD-10-CM | POA: Diagnosis not present

## 2019-04-25 ENCOUNTER — Other Ambulatory Visit: Payer: Self-pay

## 2019-04-25 ENCOUNTER — Ambulatory Visit: Payer: Medicaid Other | Attending: Internal Medicine | Admitting: Internal Medicine

## 2019-04-25 ENCOUNTER — Encounter: Payer: Self-pay | Admitting: Internal Medicine

## 2019-04-25 DIAGNOSIS — F17210 Nicotine dependence, cigarettes, uncomplicated: Secondary | ICD-10-CM | POA: Diagnosis not present

## 2019-04-25 DIAGNOSIS — E6609 Other obesity due to excess calories: Secondary | ICD-10-CM | POA: Insufficient documentation

## 2019-04-25 DIAGNOSIS — Z7984 Long term (current) use of oral hypoglycemic drugs: Secondary | ICD-10-CM | POA: Insufficient documentation

## 2019-04-25 DIAGNOSIS — F17200 Nicotine dependence, unspecified, uncomplicated: Secondary | ICD-10-CM | POA: Insufficient documentation

## 2019-04-25 DIAGNOSIS — Z79899 Other long term (current) drug therapy: Secondary | ICD-10-CM | POA: Insufficient documentation

## 2019-04-25 DIAGNOSIS — I10 Essential (primary) hypertension: Secondary | ICD-10-CM | POA: Diagnosis present

## 2019-04-25 DIAGNOSIS — E282 Polycystic ovarian syndrome: Secondary | ICD-10-CM | POA: Diagnosis not present

## 2019-04-25 DIAGNOSIS — Z6836 Body mass index (BMI) 36.0-36.9, adult: Secondary | ICD-10-CM | POA: Insufficient documentation

## 2019-04-25 DIAGNOSIS — Z3009 Encounter for other general counseling and advice on contraception: Secondary | ICD-10-CM | POA: Diagnosis not present

## 2019-04-25 NOTE — Progress Notes (Signed)
Virtual Visit via Telephone Note Due to current restrictions/limitations of in-office visits due to the COVID-19 pandemic, this scheduled clinical appointment was converted to a telehealth visit  I connected with Colleen Bradley on 04/25/19 at 9:16 a.m by telephone and verified that I am speaking with the correct person using two identifiers. I am in my office.  The patient is at home.  Only the patient and myself participated in this encounter.  I discussed the limitations, risks, security and privacy concerns of performing an evaluation and management service by telephone and the availability of in person appointments. I also discussed with the patient that there may be a patient responsible charge related to this service. The patient expressed understanding and agreed to proceed.   History of Present Illness: Pt is AAF. Previous PCP was Dr. Billee Cashing whose practice was close 1-2 yrs ago due to loss of license. Pt gives hx of HTN, tob dep and PCOS Pt wanted to have check up and wanting to get on BCP.  Not currently on any meds  HTN: dx with HTN in her teens.  Not on any meds for BP and never in past Does have BP device but does not check She tries to limit salt in foods No CP, SOB, LE edema, dizziness. Endorses HA.  PCOS: she was on Metformin in the past for this.  No PreDM Reports regular cycle.  Cycle was irregular when she was teen.   Last pap when she was a teen. -wants BCP to help dec cramps.  However, she wants to get pregnant "real soon."  Had abortion 5 yrs ago.  Obesity:  wghs 240-250. Working on trying to lose wgh.  States she is changing eating habits - less fatty foods, less snacking, eating more fruits/veggies.  "Now I need to work on drinking less sodas, and juices and more water."   -Walking in her neighborhood, stays active doing house chores.  Plans to get gym membership with her sister  Tob dep:  Smoking 5 cig/day.  Use to smoke 1 pk a day.  Smoked for 15 yrs.   Wants to quit. Chews spearmint gum to help her quit.   Family, social, surgical history is reviewed and updated. Outpatient Encounter Medications as of 04/25/2019  Medication Sig  . Besifloxacin HCl (BESIVANCE) 0.6 % SUSP Apply 1 drop to eye every 2 (two) hours. (Patient not taking: Reported on 04/25/2019)  . cephALEXin (KEFLEX) 500 MG capsule Take 1 capsule (500 mg total) by mouth 2 (two) times daily. (Patient not taking: Reported on 04/25/2019)  . doxycycline (VIBRAMYCIN) 100 MG capsule Take 1 capsule (100 mg total) by mouth 2 (two) times daily. (Patient not taking: Reported on 04/25/2019)  . HYDROcodone-acetaminophen (NORCO/VICODIN) 5-325 MG tablet Take 1-2 tablets by mouth every 6 (six) hours as needed. (Patient not taking: Reported on 04/25/2019)  . ibuprofen (ADVIL,MOTRIN) 600 MG tablet Take 1 tablet (600 mg total) by mouth every 6 (six) hours as needed. (Patient not taking: Reported on 04/25/2019)  . metFORMIN (GLUCOPHAGE) 500 MG tablet Take 1 tablet by mouth once a day  . metroNIDAZOLE (FLAGYL) 500 MG tablet Take 1 tablet (500 mg total) by mouth 2 (two) times daily.  Marland Kitchen oxyCODONE-acetaminophen (PERCOCET) 10-325 MG tablet Take 1 tablet by mouth 3 (three) times daily as needed. for pain   No facility-administered encounter medications on file as of 04/25/2019.   Social History   Tobacco Use  . Smoking status: Current Every Day Smoker    Packs/day:  0.25    Years: 15.00    Pack years: 3.75    Types: Cigarettes  . Smokeless tobacco: Never Used  Substance Use Topics  . Alcohol use: Yes    Comment: occasionally  . Drug use: Not Currently   Observations/Objective: No direct observation done as this was a telephone encounter.  Assessment and Plan: 1. Essential hypertension I went over what normal blood pressure reading is with her.  Encouraged her to check her blood pressure at least once a week and bring in those readings on her in person visit with me  2. PCOS (polycystic ovarian  syndrome) She is having regular menses at this time so we will not restart Metformin.  3. Tobacco dependence Advised to quit.  Discussed health risks associated with smoking.  Discussed methods to help her quit including trying cold [place], cutting back gradually, use of nicotine replacement products, Chantix, Wellbutrin.  She plans to continue cutting back with goal of quitting.  Have encouraged her to set a quit date.  4. Class 2 obesity due to excess calories without serious comorbidity with body mass index (BMI) of 36.0 to 36.9 in adult Commended her on trying to make changes in her eating habits.  Dietary counseling given.  Advised to eliminate sugary drinks from the diet.  Cut back on portion sizes of white carbohydrates, incorporate fresh fruits and vegetables into the diet, eat more white lean meat than red meat.  Encouraged her to get in some form of moderate intensity exercise for total of 150 minutes/week.  5. Family planning counseling Advised patient that if she wants to get pregnant very soon she needs to think about whether she really wants to be on birth control.  If she does decide that she wants to be on birth control, I would need to see what her blood pressure looks like to determine best method of family planning for her.  She will follow-up with [place] in 4 weeks for Pap smear and we will discuss family-planning further at that time.   Follow Up Instructions: 4 wks   I discussed the assessment and treatment plan with the patient. The patient was provided an opportunity to ask questions and all were answered. The patient agreed with the plan and demonstrated an understanding of the instructions.   The patient was advised to call back or seek an in-person evaluation if the symptoms worsen or if the condition fails to improve as anticipated.  I provided 27 minutes of non-face-to-face time during this encounter.   Karle Plumber, MD

## 2019-05-27 ENCOUNTER — Ambulatory Visit: Payer: Medicaid Other | Admitting: Internal Medicine

## 2019-05-29 ENCOUNTER — Ambulatory Visit: Payer: Medicaid Other | Admitting: Internal Medicine

## 2019-06-20 ENCOUNTER — Other Ambulatory Visit (HOSPITAL_COMMUNITY)
Admission: RE | Admit: 2019-06-20 | Discharge: 2019-06-20 | Disposition: A | Discharge status: Home / Self Care | Payer: Medicaid Other | Source: Ambulatory Visit | Attending: Internal Medicine | Admitting: Internal Medicine

## 2019-06-20 ENCOUNTER — Ambulatory Visit: Payer: Medicaid Other | Attending: Internal Medicine | Admitting: Internal Medicine

## 2019-06-20 ENCOUNTER — Ambulatory Visit (HOSPITAL_BASED_OUTPATIENT_CLINIC_OR_DEPARTMENT_OTHER): Payer: Medicaid Other | Admitting: Pharmacist

## 2019-06-20 ENCOUNTER — Encounter: Payer: Self-pay | Admitting: Internal Medicine

## 2019-06-20 ENCOUNTER — Other Ambulatory Visit: Payer: Self-pay

## 2019-06-20 VITALS — BP 119/79 | HR 97 | Temp 97.7°F | Resp 16 | Ht 67.0 in | Wt 280.8 lb

## 2019-06-20 DIAGNOSIS — Z803 Family history of malignant neoplasm of breast: Secondary | ICD-10-CM | POA: Diagnosis not present

## 2019-06-20 DIAGNOSIS — Z124 Encounter for screening for malignant neoplasm of cervix: Secondary | ICD-10-CM

## 2019-06-20 DIAGNOSIS — Z23 Encounter for immunization: Secondary | ICD-10-CM

## 2019-06-20 DIAGNOSIS — Z0000 Encounter for general adult medical examination without abnormal findings: Secondary | ICD-10-CM | POA: Diagnosis not present

## 2019-06-20 DIAGNOSIS — Z6841 Body Mass Index (BMI) 40.0 and over, adult: Secondary | ICD-10-CM | POA: Diagnosis not present

## 2019-06-20 DIAGNOSIS — Z01419 Encounter for gynecological examination (general) (routine) without abnormal findings: Secondary | ICD-10-CM | POA: Insufficient documentation

## 2019-06-20 DIAGNOSIS — Z880 Allergy status to penicillin: Secondary | ICD-10-CM | POA: Insufficient documentation

## 2019-06-20 DIAGNOSIS — Z7984 Long term (current) use of oral hypoglycemic drugs: Secondary | ICD-10-CM | POA: Insufficient documentation

## 2019-06-20 DIAGNOSIS — L680 Hirsutism: Secondary | ICD-10-CM | POA: Insufficient documentation

## 2019-06-20 DIAGNOSIS — N979 Female infertility, unspecified: Secondary | ICD-10-CM | POA: Diagnosis not present

## 2019-06-20 DIAGNOSIS — Z8249 Family history of ischemic heart disease and other diseases of the circulatory system: Secondary | ICD-10-CM | POA: Insufficient documentation

## 2019-06-20 DIAGNOSIS — N912 Amenorrhea, unspecified: Secondary | ICD-10-CM

## 2019-06-20 DIAGNOSIS — E282 Polycystic ovarian syndrome: Secondary | ICD-10-CM | POA: Insufficient documentation

## 2019-06-20 DIAGNOSIS — F17200 Nicotine dependence, unspecified, uncomplicated: Secondary | ICD-10-CM

## 2019-06-20 DIAGNOSIS — F17210 Nicotine dependence, cigarettes, uncomplicated: Secondary | ICD-10-CM | POA: Insufficient documentation

## 2019-06-20 DIAGNOSIS — Z833 Family history of diabetes mellitus: Secondary | ICD-10-CM | POA: Diagnosis not present

## 2019-06-20 LAB — POCT URINE PREGNANCY: Preg Test, Ur: NEGATIVE

## 2019-06-20 MED ORDER — EFLORNITHINE HCL 13.9 % EX CREA: TOPICAL_CREAM | CUTANEOUS | 1 refills | Status: DC

## 2019-06-20 NOTE — Progress Notes (Signed)
Patient presents for vaccination against tetanus per orders of Dr. Johnson. Consent given. Counseling provided. No contraindications exists. Vaccine administered without incident.   

## 2019-06-20 NOTE — Progress Notes (Signed)
Pt states she had a period last month but she is last this month

## 2019-06-20 NOTE — Patient Instructions (Signed)
Td (Tetanus, Diphtheria) Vaccine: What You Need to Know 1. Why get vaccinated? Td vaccine can prevent tetanus and diphtheria. Tetanus enters the body through cuts or wounds. Diphtheria spreads from person to person.  TETANUS (T) causes painful stiffening of the muscles. Tetanus can lead to serious health problems, including being unable to open the mouth, having trouble swallowing and breathing, or death.  DIPHTHERIA (D) can lead to difficulty breathing, heart failure, paralysis, or death. 2. Td vaccine Td is only for children 7 years and older, adolescents, and adults.  Td is usually given as a booster dose every 10 years, but it can also be given earlier after a severe and dirty wound or burn. Another vaccine, called Tdap, that protects against pertussis, also known as "whooping cough," in addition to tetanus and diphtheria, may be used instead of Td.  Td may be given at the same time as other vaccines. 3. Talk with your health care provider Tell your vaccine provider if the person getting the vaccine:  Has had an allergic reaction after a previous dose of any vaccine that protects against tetanus or diphtheria, or has any severe, life-threatening allergies.  Has ever had Guillain-Barr Syndrome (also called GBS).  Has had severe pain or swelling after a previous dose of any vaccine that protects against tetanus or diphtheria. In some cases, your health care provider may decide to postpone Td vaccination to a future visit.  People with minor illnesses, such as a cold, may be vaccinated. People who are moderately or severely ill should usually wait until they recover before getting Td vaccine.  Your health care provider can give you more information. 4. Risks of a vaccine reaction  Pain, redness, or swelling where the shot was given, mild fever, headache, feeling tired, and nausea, vomiting, diarrhea, or stomachache sometimes happen after Td vaccine. People sometimes faint after medical  procedures, including vaccination. Tell your provider if you feel dizzy or have vision changes or ringing in the ears.  As with any medicine, there is a very remote chance of a vaccine causing a severe allergic reaction, other serious injury, or death. 5. What if there is a serious problem? An allergic reaction could occur after the vaccinated person leaves the clinic. If you see signs of a severe allergic reaction (hives, swelling of the face and throat, difficulty breathing, a fast heartbeat, dizziness, or weakness), call 9-1-1 and get the person to the nearest hospital.  For other signs that concern you, call your health care provider.  Adverse reactions should be reported to the Vaccine Adverse Event Reporting System (VAERS). Your health care provider will usually file this report, or you can do it yourself. Visit the VAERS website at www.vaers.hhs.gov or call 1-800-822-7967. VAERS is only for reporting reactions, and VAERS staff do not give medical advice. 6. The National Vaccine Injury Compensation Program The National Vaccine Injury Compensation Program (VICP) is a federal program that was created to compensate people who may have been injured by certain vaccines. Visit the VICP website at www.hrsa.gov/vaccinecompensation or call 1-800-338-2382 to learn about the program and about filing a claim. There is a time limit to file a claim for compensation. 7. How can I learn more?  Ask your health care provider.  Call your local or state health department.  Contact the Centers for Disease Control and Prevention (CDC): ? Call 1-800-232-4636 (1-800-CDC-INFO) or ? Visit CDC's website at www.cdc.gov/vaccines Vaccine Information Statement Td Vaccine (07/17/18) This information is not intended to replace advice given   to you by your health care provider. Make sure you discuss any questions you have with your health care provider. Document Revised: 08/26/2018 Document Reviewed: 07/29/2018 Elsevier  Patient Education  2020 Elsevier Inc.  

## 2019-06-20 NOTE — Progress Notes (Signed)
Patient ID: Colleen Bradley, female    DOB: 1991-01-03  MRN: [id number]  CC: Annual Exam and Gynecologic Exam   Subjective: Colleen Bradley is a 29 y.o. female who presents for well woman exam Her concerns today include:   Pt is G1P0 Does not recall when she had last pap Hx of PCOS but menses have been regular over the past several years..  Last menses last mth.  Suppose to have come 06/16/2019 so she is several days late.  Menses last 3-5 days.  Mild bleeding.  No vaginal discgh or itching.  Request STD screening.  She had HIV and syphilis screening last year and she does not wish for those to be repeated. Sexually active with one partner She still wants to get pregnant.  She has been trying to get pregnant for several yrs.  Not on any birth control method.   She has hair under the chin and along the vermilion border which she associates with PCOS.  She usually shaves.  She is wanting to know if there is some medication for Vaniqa cream that I can prescribe for her Family history of breast cancer in a paternal aunt  Tob dep: use to smoke 1 pk a day.  Now 1 pk last 4 days.  Trying to quit.  She has not set a quit date.    Obesity:  Has cut out sodas and red meats Not exercising.  Plans to start walking with her dog.  Patient Active Problem List   Diagnosis Date Noted  . Tobacco dependence 04/25/2019  . PCOS (polycystic ovarian syndrome) 06/14/2006  . OBESITY, NOS 06/14/2006  . Essential hypertension 06/14/2006  . METRORRHAGIA 06/14/2006  . ACNE 06/14/2006     Current Outpatient Medications on File Prior to Visit  Medication Sig Dispense Refill  . metFORMIN (GLUCOPHAGE) 500 MG tablet Take 1 tablet by mouth once a day  0  . oxyCODONE-acetaminophen (PERCOCET) 10-325 MG tablet Take 1 tablet by mouth 3 (three) times daily as needed. for pain  0   No current facility-administered medications on file prior to visit.    Allergies  Allergen Reactions  . Penicillins Hives and Itching   Has patient had a PCN reaction causing immediate rash, facial/tongue/throat swelling, SOB or lightheadedness with hypotension: Yes Has patient had a PCN reaction causing severe rash involving mucus membranes or skin necrosis: No Has patient had a PCN reaction that required hospitalization Yes Has patient had a PCN reaction occurring within the last 10 years: No If all of the above answers are "NO", then may proceed with Cephalosporin use.     Social History   Socioeconomic History  . Marital status: Single    Spouse name: Not on file  . Number of children: 0  . Years of education: Not on file  . Highest education level: Not on file  Occupational History  . Not on file  Tobacco Use  . Smoking status: Current Every Day Smoker    Packs/day: 0.25    Years: 15.00    Pack years: 3.75    Types: Cigarettes  . Smokeless tobacco: Never Used  Substance and Sexual Activity  . Alcohol use: Yes    Comment: occasionally  . Drug use: Not Currently  . Sexual activity: Yes    Birth control/protection: None  Other Topics Concern  . Not on file  Social History Narrative  . Not on file   Social Determinants of Health   Financial Resource Strain:   .  Difficulty of Paying Living Expenses: Not on file  Food Insecurity:   . Worried About Programme researcher, broadcasting/film/video in the Last Year: Not on file  . Ran Out of Food in the Last Year: Not on file  Transportation Needs:   . Lack of Transportation (Medical): Not on file  . Lack of Transportation (Non-Medical): Not on file  Physical Activity:   . Days of Exercise per Week: Not on file  . Minutes of Exercise per Session: Not on file  Stress:   . Feeling of Stress : Not on file  Social Connections:   . Frequency of Communication with Friends and Family: Not on file  . Frequency of Social Gatherings with Friends and Family: Not on file  . Attends Religious Services: Not on file  . Active Member of Clubs or Organizations: Not on file  . Attends Tax inspector Meetings: Not on file  . Marital Status: Not on file  Intimate Partner Violence:   . Fear of Current or Ex-Partner: Not on file  . Emotionally Abused: Not on file  . Physically Abused: Not on file  . Sexually Abused: Not on file    Family History  Problem Relation Age of Onset  . Hypertension Mother   . Diabetes Father   . Diabetes Brother   . Hypertension Maternal Grandmother     Past Surgical History:  Procedure Laterality Date  . arm surgery Left   . TONSILLECTOMY      ROS: Review of Systems Negative except as stated above  PHYSICAL EXAM: BP 119/79   Pulse 97   Temp 97.7 F (36.5 C)   Resp 16   Ht 5\' 7"  (1.702 m)   Wt 280 lb 12.8 oz (127.4 kg)   SpO2 100%   BMI 43.98 kg/m    Wt Readings from Last 3 Encounters:  06/20/19 280 lb 12.8 oz (127.4 kg)  11/26/18 240 lb (108.9 kg)  07/19/18 240 lb (108.9 kg)    Physical Exam  General appearance - alert, well appearing, morbidly obese young female and in no distress Mental status - normal mood, behavior, speech, dress, motor activity, and thought processes Chest - clear to auscultation, no wheezes, rales or rhonchi, symmetric air entry Heart - normal rate, regular rhythm, normal S1, S2, no murmurs, rubs, clicks or gallops Breasts - breasts appear normal, no suspicious masses, no skin or nipple changes or axillary nodes Pelvic -CMA Pollock present: Normal external genitalia, vulva, vagina, cervix, uterus and adnexa Skin -mild to moderate hirsutism on the chin and vermilion border  Results for orders placed or performed in visit on 06/20/19  POCT urine pregnancy  Result Value Ref Range   Preg Test, Ur Negative Negative    CMP Latest Ref Rng & Units 11/26/2018 05/06/2016 04/14/2014  Glucose 70 - 99 mg/dL 04/16/2014) 474(Q) 595(G)  BUN 6 - 20 mg/dL 7 8 6   Creatinine 0.44 - 1.00 mg/dL 387(F) 6.43(P  Sodium 135 - 145 mmol/L 137 137 135  Potassium 3.5 - 5.1 mmol/L 3.6 3.9 3.5  Chloride 98 - 111 mmol/L  102 103 98  CO2 22 - 32 mmol/L 25 25 29   Calcium 8.9 - 10.3 mg/dL 2.95) 9.2 9.5  Total Protein 6.5 - 8.1 g/dL - 7.5 -  Total Bilirubin 0.3 - 1.2 mg/dL - 0.4 -  Alkaline Phos 38 - 126 U/L - 50 -  AST 15 - 41 U/L - 16 -  ALT 14 - 54 U/L - 13(L) -  Lipid Panel  No results found for: CHOL, TRIG, HDL, CHOLHDL, VLDL, LDLCALC, LDLDIRECT  CBC    Component Value Date/Time   WBC 12.7 (H) 11/26/2018 1513   RBC 4.66 11/26/2018 1513   HGB 13.5 11/26/2018 1513   HCT 42.3 11/26/2018 1513   PLT 344 11/26/2018 1513   MCV 90.8 11/26/2018 1513   MCH 29.0 11/26/2018 1513   MCHC 31.9 11/26/2018 1513   RDW 13.0 11/26/2018 1513   LYMPHSABS 2.5 11/26/2018 1513   MONOABS 0.9 11/26/2018 1513   EOSABS 0.2 11/26/2018 1513   BASOSABS 0.0 11/26/2018 1513    ASSESSMENT AND PLAN: 1. Health maintenance examination 2. Pap smear for cervical cancer screening - Cervicovaginal ancillary only - Cytology - PAP  3. Class 3 severe obesity due to excess calories without serious comorbidity with body mass index (BMI) of 40.0 to 44.9 in adult [facility]) Dietary counseling given.  She is agreeable to seeing a nutritionist. Encourage some form of moderate intensity exercise 3 to 4 days a week.  Goal is to get in at least 150 minutes/week of moderate intensity exercise.  Encouraged her to try to get her weight down as much as possible before considering pregnancy - Hemoglobin A1c - Lipid panel - Comprehensive metabolic panel - Amb ref to Medical Nutrition Therapy-MNT  4. Hirsutism - Eflornithine HCl 13.9 % cream; Apply thin layer to hair under the chin twice daily  Dispense: 45 g; Refill: 1  5. Tobacco dependence Advised to quit especially with her wanting to get pregnant.  Discussed health risks associated with smoking.  She is trying to quit by weaning herself down.  Encouraged her to set a quit date.  Less than 5 minutes spent on counseling.  6. Need for Tdap vaccination Given  7. Amenorrhea Urine pregnancy  test negative  8. 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  However I have encouraged her to get her weight down as much as possible before pregnancy - Ambulatory referral to Gynecology - POCT urine pregnancy   Patient was given the opportunity to ask questions.  Patient verbalized understanding of the plan and was able to repeat key elements of the plan.   No orders of the defined types were placed in this encounter.    Requested Prescriptions   Pending Prescriptions Disp Refills  . metFORMIN (GLUCOPHAGE) 500 MG tablet  0    Sig: Take 1 tablet (500 mg total) by mouth daily.    No follow-ups on file.  Jonah Blue, MD, FACP

## 2019-06-21 LAB — COMPREHENSIVE METABOLIC PANEL
ALT: 9 IU/L (ref 0–32)
AST: 15 IU/L (ref 0–40)
Albumin/Globulin Ratio: 1.7 (ref 1.2–2.2)
Albumin: 4.7 g/dL (ref 3.9–5.0)
Alkaline Phosphatase: 71 IU/L (ref 39–117)
BUN/Creatinine Ratio: 10 (ref 9–23)
BUN: 9 mg/dL (ref 6–20)
Bilirubin Total: <0.2 mg/dL (ref 0.0–1.2)
CO2: 21 mmol/L (ref 20–29)
Calcium: 9.9 mg/dL (ref 8.7–10.2)
Chloride: 102 mmol/L (ref 96–106)
Creatinine, Ser: 0.87 mg/dL (ref 0.57–1.00)
GFR calc Af Amer: 104 mL/min/{1.73_m2} (ref >59)
GFR calc non Af Amer: 90 mL/min/{1.73_m2} (ref >59)
Globulin, Total: 2.7 g/dL (ref 1.5–4.5)
Glucose: 86 mg/dL (ref 65–99)
Potassium: 4.8 mmol/L (ref 3.5–5.2)
Sodium: 141 mmol/L (ref 134–144)
Total Protein: 7.4 g/dL (ref 6.0–8.5)

## 2019-06-21 LAB — LIPID PANEL
Chol/HDL Ratio: 3.8 ratio (ref 0.0–4.4)
Cholesterol, Total: 187 mg/dL (ref 100–199)
HDL: 49 mg/dL (ref >39)
LDL Chol Calc (NIH): 119 mg/dL — ABNORMAL HIGH (ref 0–99)
Triglycerides: 103 mg/dL (ref 0–149)
VLDL Cholesterol Cal: 19 mg/dL (ref 5–40)

## 2019-06-21 LAB — HEMOGLOBIN A1C
Est. average glucose Bld gHb Est-mCnc: 117 mg/dL
Hgb A1c MFr Bld: 5.7 % — ABNORMAL HIGH (ref 4.8–5.6)

## 2019-06-22 ENCOUNTER — Encounter: Payer: Self-pay | Admitting: Internal Medicine

## 2019-06-22 DIAGNOSIS — R7303 Prediabetes: Secondary | ICD-10-CM | POA: Insufficient documentation

## 2019-06-23 ENCOUNTER — Other Ambulatory Visit: Payer: Self-pay | Admitting: Internal Medicine

## 2019-06-23 LAB — CERVICOVAGINAL ANCILLARY ONLY
Bacterial Vaginitis (gardnerella): POSITIVE — AB
Candida Glabrata: NEGATIVE
Candida Vaginitis: POSITIVE — AB
Chlamydia: POSITIVE — AB
Comment: NEGATIVE
Comment: NEGATIVE
Comment: NEGATIVE
Comment: NEGATIVE
Comment: NEGATIVE
Comment: NORMAL
Neisseria Gonorrhea: NEGATIVE
Trichomonas: NEGATIVE

## 2019-06-23 MED ORDER — AZITHROMYCIN 500 MG PO TABS: 1000.0000 mg | ORAL_TABLET | Freq: Every day | ORAL | 0 refills | Status: DC

## 2019-06-23 MED ORDER — FLUCONAZOLE 150 MG PO TABS: 150.0000 mg | ORAL_TABLET | Freq: Once | ORAL | 0 refills | Status: AC

## 2019-06-23 MED ORDER — METRONIDAZOLE 500 MG PO TABS: 500.0000 mg | ORAL_TABLET | Freq: Two times a day (BID) | ORAL | 0 refills | Status: DC

## 2019-06-24 ENCOUNTER — Telehealth: Payer: Self-pay

## 2019-06-24 NOTE — Telephone Encounter (Signed)
Contacted pt to go over pap results pt is aware and doesn't have any questions or concerns  

## 2019-06-27 LAB — CYTOLOGY - PAP
Adequacy: ABSENT
Comment: NEGATIVE
Comment: NEGATIVE
Diagnosis: NEGATIVE
HPV 16: NEGATIVE
HPV 18 / 45: NEGATIVE
High risk HPV: POSITIVE — AB

## 2019-06-29 ENCOUNTER — Encounter: Payer: Self-pay | Admitting: Internal Medicine

## 2019-06-29 DIAGNOSIS — A749 Chlamydial infection, unspecified: Secondary | ICD-10-CM | POA: Insufficient documentation

## 2019-06-29 HISTORY — DX: Chlamydial infection, unspecified: A74.9

## 2019-08-07 ENCOUNTER — Ambulatory Visit: Payer: Medicaid Other | Admitting: Registered"

## 2019-08-25 ENCOUNTER — Other Ambulatory Visit: Payer: Self-pay | Admitting: Internal Medicine

## 2019-10-27 IMAGING — US TRANSVAGINAL ULTRASOUND OF PELVIS
1 series · 13 of 25 positions shown · non-contrast
Comparison: None.

CLINICAL DATA: Left pelvic pain for 2 days.

EXAM:
TRANSABDOMINAL AND TRANSVAGINAL ULTRASOUND OF PELVIS
DOPPLER ULTRASOUND OF OVARIES
TECHNIQUE: Both transabdominal and transvaginal ultrasound examinations of the
pelvis were performed. Transabdominal technique was performed for
global imaging of the pelvis including uterus, ovaries, adnexal
regions, and pelvic cul-de-sac.
It was necessary to proceed with endovaginal exam following the
transabdominal exam to visualize the endometrium and ovaries. Color
and duplex Doppler ultrasound was utilized to evaluate blood flow to
the ovaries.

[Series 1: transvaginal ultrasound of pelvis · 13 of 49 slices shown]
[im 1/49]
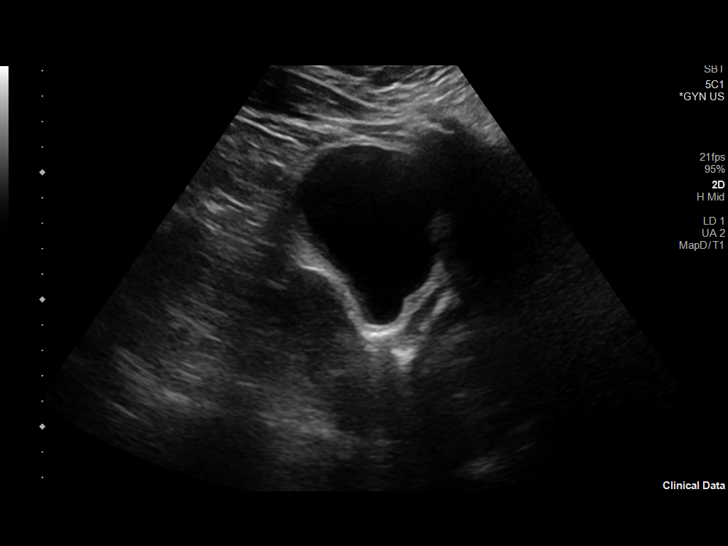
[im 5/49]
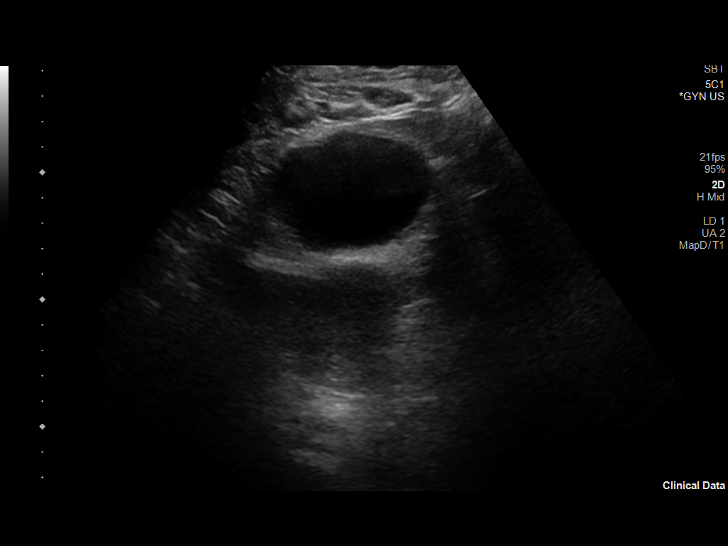
[im 9/49]
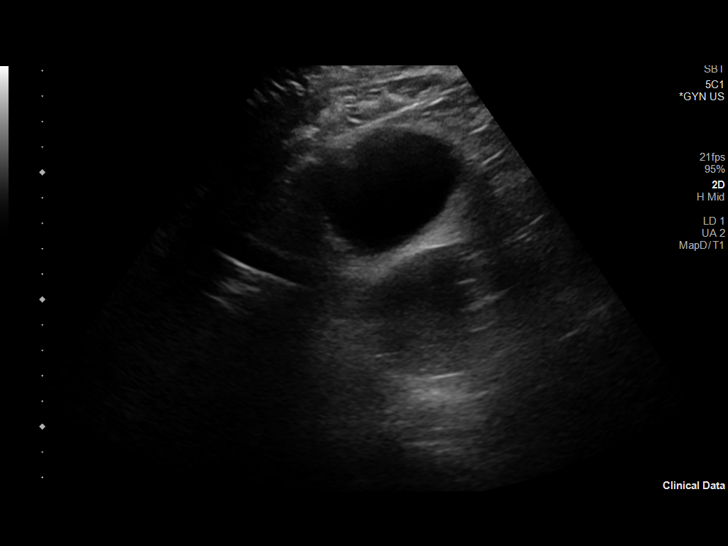
[im 13/49]
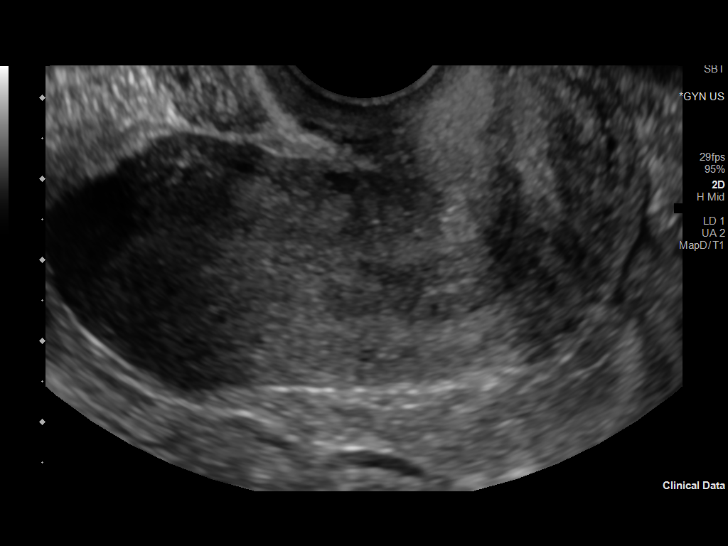
[im 17/49]
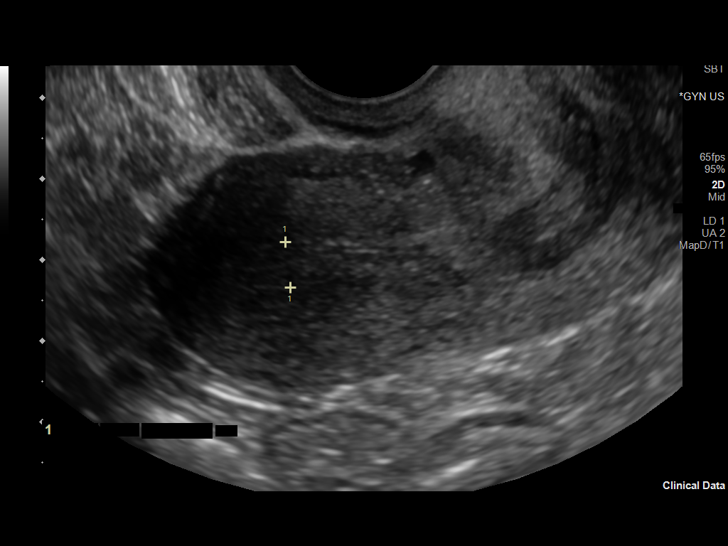
[im 21/49]
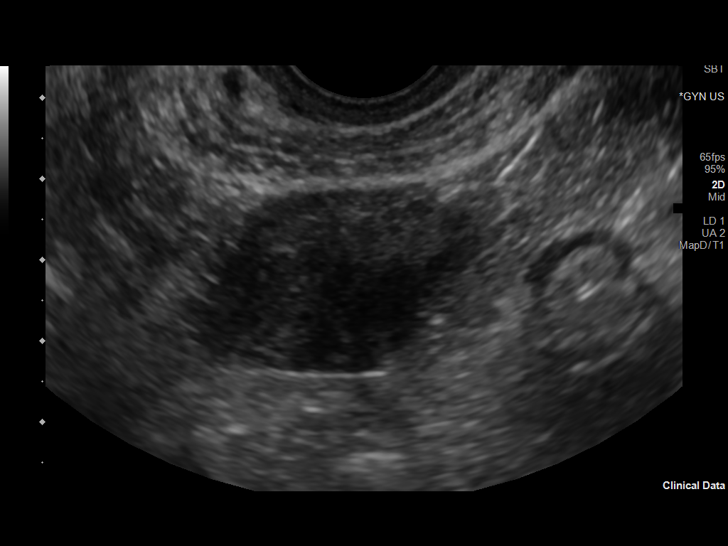
[im 25/49]
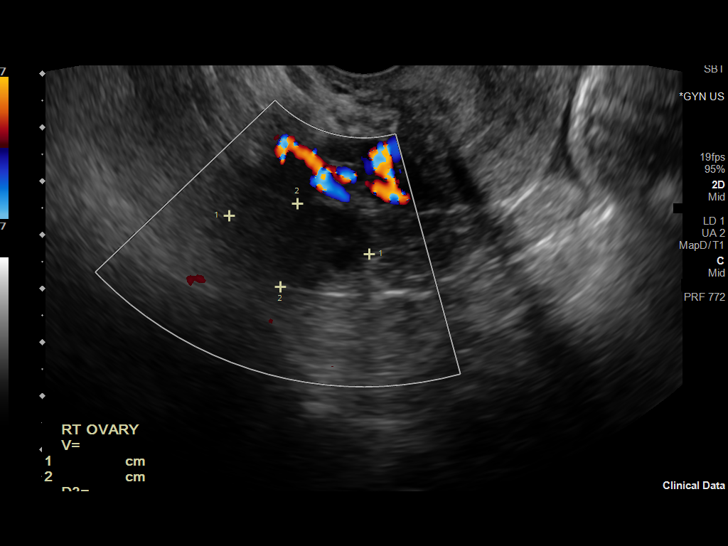
[im 29/49]
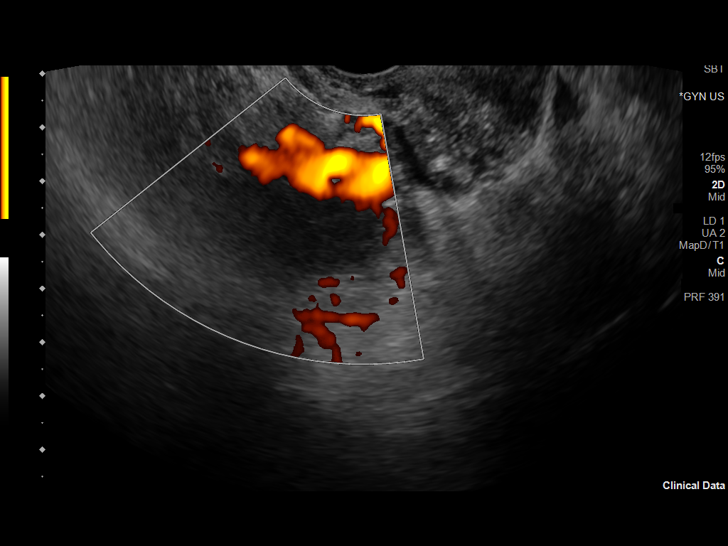
[im 33/49]
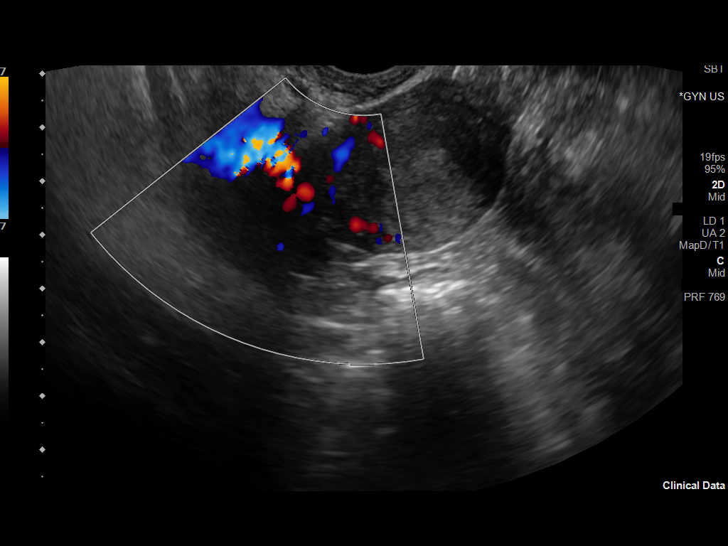
[im 37/49]
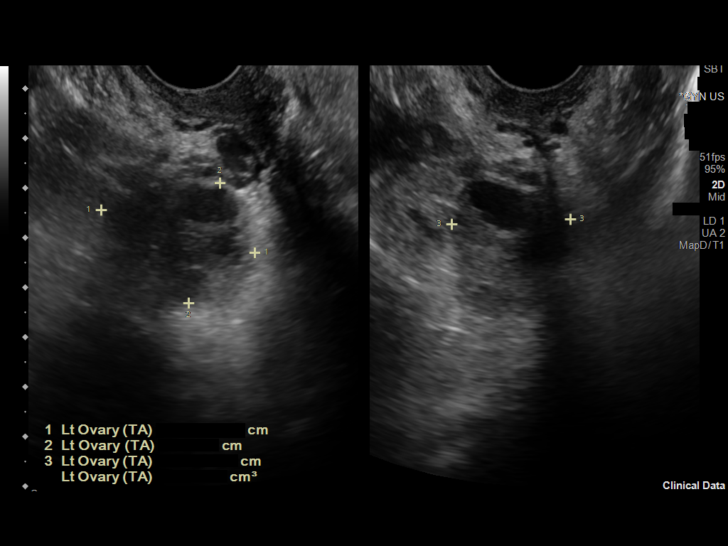
[im 41/49]
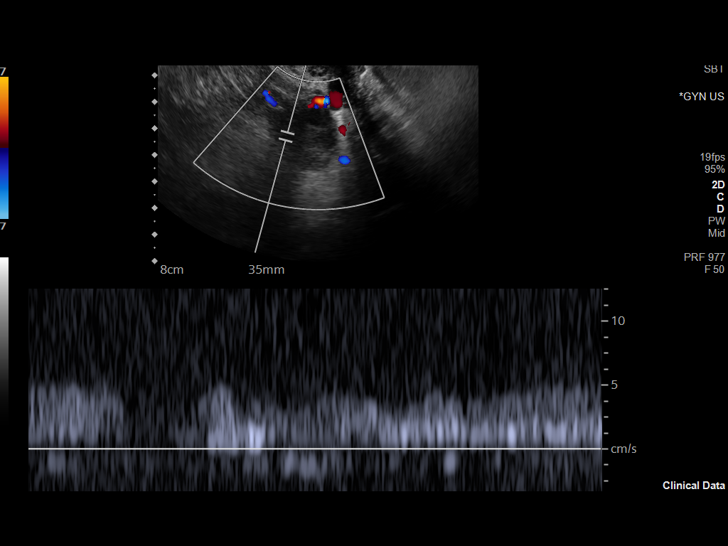
[im 45/49]
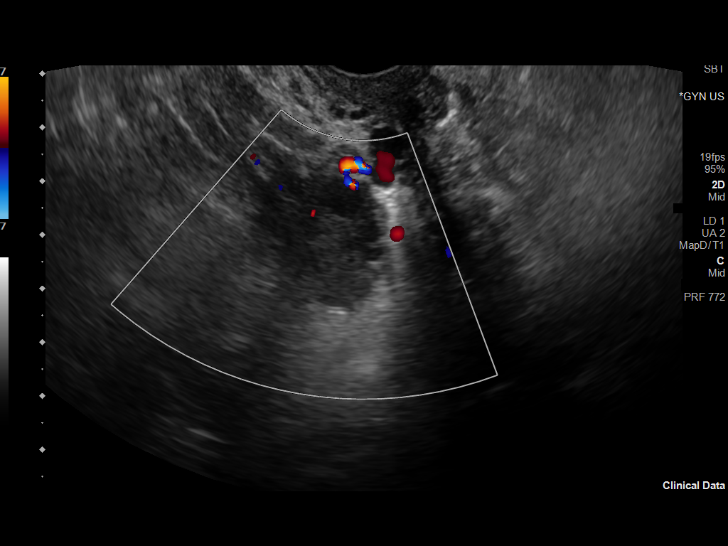
[im 49/49]
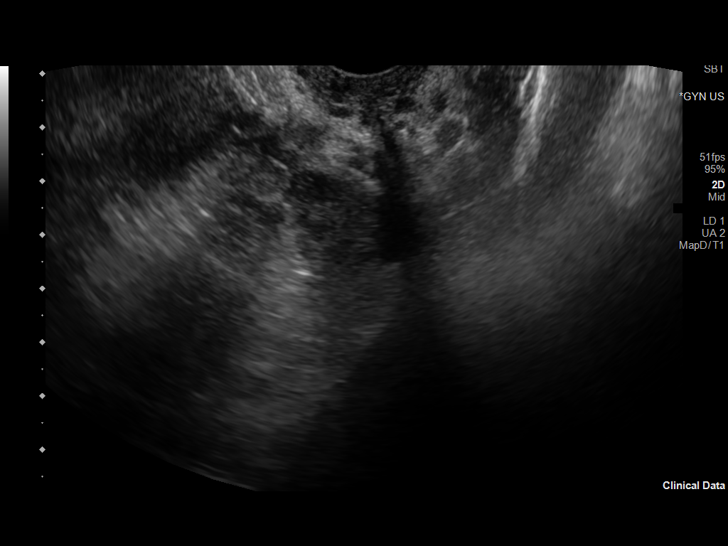

[13 of 25 positions shown; findings below may reference images not displayed]

FINDINGS: Uterus

Measurements: 7.0 x 3.2 x 3.6 cm = volume: 43 mL. No fibroids or
other mass visualized.

Endometrium

Thickness: 6 mm.  No focal abnormality visualized.

Right ovary

Measurements: Approximately 2.7 x 1.6 x 2.5 cm = volume: 5.5 mL. Not
well visualized. No gross mass.

Left ovary

Measurements: 3.2 x 2.5 x 2.4 cm = volume: 10 mL. Normal
appearance/no adnexal mass.

Pulsed Doppler evaluation of both ovaries demonstrates normal
low-resistance arterial and venous waveforms within the left ovary.
The right ovary was poorly evaluated. There was the suggestion of
low level blood flow within the right ovary on spectral Doppler
imaging, however blood flow cannot be established with certainty on
this examination.

Other findings

No abnormal free fluid.
IMPRESSION: 1. Poor visualization of the right ovary with limited assessment for
the presence of blood flow. No right ovarian enlargement to suggest
edema or mass.
2. Unremarkable appearance of the left ovary and uterus.

## 2019-11-10 ENCOUNTER — Telehealth: Payer: Self-pay | Admitting: Internal Medicine

## 2019-11-10 NOTE — Telephone Encounter (Signed)
Called patient and no voice mail was available. No sooner appt available.    Copied from CRM [id number]. Topic: Appointment Scheduling - Scheduling Inquiry for Clinic >> Nov 10, 2019  9:53 AM Dalphine Handing A wrote: Patient as some concerns and wants to know if any provider is willing to work her in today for an std. Please Colleen Bradley

## 2019-11-10 NOTE — Telephone Encounter (Signed)
Pt is aware no sooner appt and pt is on wait list

## 2019-11-12 ENCOUNTER — Emergency Department (HOSPITAL_BASED_OUTPATIENT_CLINIC_OR_DEPARTMENT_OTHER)
Admission: EM | Admit: 2019-11-12 | Discharge: 2019-11-12 | Disposition: A | Discharge status: Home / Self Care | Payer: Medicaid Other | Attending: Emergency Medicine | Admitting: Emergency Medicine

## 2019-11-12 ENCOUNTER — Encounter (HOSPITAL_BASED_OUTPATIENT_CLINIC_OR_DEPARTMENT_OTHER): Payer: Self-pay | Admitting: Emergency Medicine

## 2019-11-12 ENCOUNTER — Other Ambulatory Visit: Payer: Self-pay

## 2019-11-12 DIAGNOSIS — Z79899 Other long term (current) drug therapy: Secondary | ICD-10-CM | POA: Diagnosis not present

## 2019-11-12 DIAGNOSIS — Z202 Contact with and (suspected) exposure to infections with a predominantly sexual mode of transmission: Secondary | ICD-10-CM | POA: Insufficient documentation

## 2019-11-12 DIAGNOSIS — I10 Essential (primary) hypertension: Secondary | ICD-10-CM | POA: Diagnosis not present

## 2019-11-12 DIAGNOSIS — F17210 Nicotine dependence, cigarettes, uncomplicated: Secondary | ICD-10-CM | POA: Insufficient documentation

## 2019-11-12 LAB — URINALYSIS, MICROSCOPIC (REFLEX)

## 2019-11-12 LAB — URINALYSIS, ROUTINE W REFLEX MICROSCOPIC
Bilirubin Urine: NEGATIVE
Glucose, UA: NEGATIVE mg/dL
Ketones, ur: NEGATIVE mg/dL
Leukocytes,Ua: NEGATIVE
Nitrite: NEGATIVE
Protein, ur: 30 mg/dL — AB
Specific Gravity, Urine: 1.025 (ref 1.005–1.030)
pH: 5.5 (ref 5.0–8.0)

## 2019-11-12 LAB — PREGNANCY, URINE: Preg Test, Ur: NEGATIVE

## 2019-11-12 LAB — WET PREP, GENITAL
Clue Cells Wet Prep HPF POC: NONE SEEN
Sperm: NONE SEEN
Trich, Wet Prep: NONE SEEN
Yeast Wet Prep HPF POC: NONE SEEN

## 2019-11-12 MED ORDER — DOXYCYCLINE HYCLATE 100 MG PO TABS
100.0000 mg | ORAL_TABLET | Freq: Once | ORAL | Status: AC
  Administered 2019-11-12: 100 mg via ORAL
  Filled 2019-11-12: qty 1

## 2019-11-12 MED ORDER — CEFTRIAXONE SODIUM 500 MG IJ SOLR
500.0000 mg | Freq: Once | INTRAMUSCULAR | Status: AC
  Administered 2019-11-12: 500 mg via INTRAMUSCULAR
  Filled 2019-11-12: qty 500

## 2019-11-12 MED ORDER — LIDOCAINE HCL (PF) 1 % IJ SOLN
INTRAMUSCULAR | Status: AC
  Administered 2019-11-12: 1 mL
  Filled 2019-11-12: qty 5

## 2019-11-12 MED ORDER — METRONIDAZOLE 500 MG PO TABS: 500.0000 mg | ORAL_TABLET | Freq: Two times a day (BID) | ORAL | 0 refills | Status: DC

## 2019-11-12 MED ORDER — METRONIDAZOLE 500 MG PO TABS
500.0000 mg | ORAL_TABLET | Freq: Once | ORAL | Status: AC
  Administered 2019-11-12: 500 mg via ORAL
  Filled 2019-11-12: qty 1

## 2019-11-12 MED ORDER — DOXYCYCLINE HYCLATE 100 MG PO CAPS: 100.0000 mg | ORAL_CAPSULE | Freq: Two times a day (BID) | ORAL | 0 refills | Status: DC

## 2019-11-12 NOTE — ED Triage Notes (Signed)
Pt states her partner was tested for STD and got treated and she wants to be tested

## 2019-11-12 NOTE — ED Provider Notes (Addendum)
[facility] EMERGENCY DEPARTMENT Provider Note   CSN: [id number] Arrival date & time: 11/12/19  0600     History Chief Complaint  Patient presents with  . Exposure to STD    Colleen Bradley is a 29 y.o. female.  Patient with hypertension and PCOS here for STD check.  States her significant other recently tested positive for trichomonas.  She has ongoing vaginal discharge for the past 2 weeks.  She admits to frequent unprotected sex with her partner.  Denies any abdominal pain, nausea, vomiting, fever.  No pain with urination or blood in the urine.  Has a clear to white vaginal discharge.  No vaginal bleeding. She does not believe that she is pregnant.  The history is provided by the patient.  Exposure to STD Pertinent negatives include no chest pain, no abdominal pain, no headaches and no shortness of breath.       Past Medical History:  Diagnosis Date  . Chlamydia 06/29/2019  . Hypertension   . Obesity   . PCOS (polycystic ovarian syndrome)     Patient Active Problem List   Diagnosis Date Noted  . Chlamydia 06/29/2019  . Prediabetes 06/22/2019  . Hirsutism 06/20/2019  . Tobacco dependence 04/25/2019  . PCOS (polycystic ovarian syndrome) 06/14/2006  . OBESITY, NOS 06/14/2006  . Essential hypertension 06/14/2006  . METRORRHAGIA 06/14/2006  . ACNE 06/14/2006    Past Surgical History:  Procedure Laterality Date  . arm surgery Left   . TONSILLECTOMY       OB History    Gravida  1   Para  0   Term      Preterm      AB      Living  0     SAB      TAB      Ectopic      Multiple      Live Births              Family History  Problem Relation Age of Onset  . Hypertension Mother   . Diabetes Father   . Diabetes Brother   . Hypertension Maternal Grandmother     Social History   Tobacco Use  . Smoking status: Current Every Day Smoker    Packs/day: 0.25    Years: 15.00    Pack years: 3.75    Types: Cigarettes  . Smokeless  tobacco: Never Used  Vaping Use  . Vaping Use: Never used  Substance Use Topics  . Alcohol use: Yes    Comment: occasionally  . Drug use: Not Currently    Home Medications Prior to Admission medications   Medication Sig Start Date End Date Taking? Authorizing Provider  azithromycin (ZITHROMAX) 500 MG tablet Take 2 tablets (1,000 mg total) by mouth daily. 06/23/19   Marcine Matar, MD  Eflornithine HCl 13.9 % cream Apply thin layer to hair under the chin twice daily 06/20/19   Marcine Matar, MD  metFORMIN (GLUCOPHAGE) 500 MG tablet Take 1 tablet by mouth once a day 06/16/15   [provider]  metroNIDAZOLE (FLAGYL) 500 MG tablet Take 1 tablet (500 mg total) by mouth 2 (two) times daily. 06/23/19   Marcine Matar, MD  oxyCODONE-acetaminophen (PERCOCET) 10-325 MG tablet Take 1 tablet by mouth 3 (three) times daily as needed. for pain 04/08/16   [provider]    Allergies    Penicillins  Review of Systems   Review of Systems  Constitutional: Negative for  activity change, appetite change and fever.  HENT: Negative for congestion and rhinorrhea.   Eyes: Negative for visual disturbance.  Respiratory: Negative for cough, chest tightness and shortness of breath.   Cardiovascular: Negative for chest pain.  Gastrointestinal: Negative for abdominal pain, nausea and vomiting.  Genitourinary: Positive for vaginal discharge. Negative for dysuria and hematuria.  Musculoskeletal: Negative for arthralgias and myalgias.  Skin: Negative for rash.  Neurological: Negative for dizziness, weakness and headaches.    all other systems are negative except as noted in the HPI and PMH.   Physical Exam Updated Vital Signs BP (!) 150/102 (BP Location: Left Arm)   Pulse (!) 107   Temp 99.3 F (37.4 C) (Oral)   Resp 14   Ht 5\' 7"  (1.702 m)   Wt (!) 117.9 kg   LMP 11/02/2019 (Exact Date)   SpO2 100%   BMI 40.72 kg/m   Physical Exam Vitals and nursing note reviewed.    Constitutional:      General: She is not in acute distress.    Appearance: She is well-developed. She is obese.  HENT:     Head: Normocephalic and atraumatic.     Mouth/Throat:     Pharynx: No oropharyngeal exudate.  Eyes:     Conjunctiva/sclera: Conjunctivae normal.     Pupils: Pupils are equal, round, and reactive to light.  Neck:     Comments: No meningismus. Cardiovascular:     Rate and Rhythm: Normal rate and regular rhythm.     Heart sounds: Normal heart sounds. No murmur heard.   Pulmonary:     Effort: Pulmonary effort is normal. No respiratory distress.     Breath sounds: Normal breath sounds.  Abdominal:     Palpations: Abdomen is soft.     Tenderness: There is no abdominal tenderness. There is no guarding or rebound.  Genitourinary:    Comments: Chaperone present.  Normal external genitalia.  Clear discharge in vaginal vault.  No CMT.  No lateralizing adnexal tenderness. Musculoskeletal:        General: No tenderness. Normal range of motion.     Cervical back: Normal range of motion and neck supple.  Skin:    General: Skin is warm.     Capillary Refill: Capillary refill takes less than 2 seconds.  Neurological:     Mental Status: She is alert and oriented to person, place, and time.     Cranial Nerves: No cranial nerve deficit.     Motor: No abnormal muscle tone.     Coordination: Coordination normal.     Comments:  5/5 strength throughout. CN 2-12 intact.Equal grip strength.   Psychiatric:        Behavior: Behavior normal.     ED Results / Procedures / Treatments   Labs (all labs ordered are listed, but only abnormal results are displayed) Labs Reviewed  WET PREP, GENITAL - Abnormal; Notable for the following components:      Result Value   WBC, Wet Prep HPF POC MODERATE (*)    All other components within normal limits  URINALYSIS, ROUTINE W REFLEX MICROSCOPIC - Abnormal; Notable for the following components:   Hgb urine dipstick LARGE (*)    Protein,  ur 30 (*)    All other components within normal limits  URINALYSIS, MICROSCOPIC (REFLEX) - Abnormal; Notable for the following components:   Bacteria, UA FEW (*)    All other components within normal limits  PREGNANCY, URINE  GC/CHLAMYDIA PROBE AMP ([facility]) NOT AT  [facility]    EKG None  Radiology No results found.  Procedures Procedures (including critical care time)  Medications Ordered in ED Medications  cefTRIAXone (ROCEPHIN) injection 500 mg (500 mg Intramuscular Given 11/12/19 0707)  doxycycline (VIBRA-TABS) tablet 100 mg (100 mg Oral Given 11/12/19 0706)  metroNIDAZOLE (FLAGYL) tablet 500 mg (500 mg Oral Given 11/12/19 0706)  lidocaine (PF) (XYLOCAINE) 1 % injection (1 mL  Given 11/12/19 5784)    ED Course  I have reviewed the triage vital signs and the nursing notes.  Pertinent labs & imaging results that were available during my care of the patient were reviewed by me and considered in my medical decision making (see chart for details).    MDM Rules/Calculators/A&P                         Vaginal discharge for 2 weeks with her partner testing positive for trichomonas.  Her abdomen is soft.  No peritoneal signs.  No evidence of PID on pelvic exam. Patient requesting empiric treatment for GC, chlamydia and trichomonas.  She has tolerated Rocephin in the past despite her penicillin allergy  Treated in the ED empirically. Safe sex practices discussed.  Followup with health department. Return precautions discussed.  Final Clinical Impression(s) / ED Diagnoses Final diagnoses:  STD exposure    Rx / DC Orders ED Discharge Orders         Ordered    doxycycline (VIBRAMYCIN) 100 MG capsule  2 times daily     Discontinue  Reprint     11/12/19 0705    metroNIDAZOLE (FLAGYL) 500 MG tablet  2 times daily     Discontinue  Reprint     11/12/19 0705           Glynn Octave, MD 11/12/19 [id number]    Glynn Octave, MD 11/12/19 [id number]

## 2019-11-12 NOTE — Discharge Instructions (Signed)
Take the antibiotics as prescribed.  Today are treated for gonorrhea, chlamydia and trichomonas.  Use a condom with every sexual encounter.  Your sexual partner should be treated as well.  Follow-up with the health department.  Return to the ED with worsening pain, fever, vomiting, any other concerns.

## 2019-11-13 LAB — GC/CHLAMYDIA PROBE AMP (~~LOC~~) NOT AT ARMC
Chlamydia: NEGATIVE
Comment: NEGATIVE
Comment: NORMAL
Neisseria Gonorrhea: NEGATIVE

## 2019-11-19 NOTE — Progress Notes (Deleted)
Patient ID: Colleen Bradley, female   DOB: [birth year], 29 y.o.   MRN: [id number]   After ED visit 11/12/2019 for STD check.  From ED A/P: Vaginal discharge for 2 weeks with her partner testing positive for trichomonas.  Her abdomen is soft.  No peritoneal signs.  No evidence of PID on pelvic exam. Patient requesting empiric treatment for GC, chlamydia and trichomonas.  She has tolerated Rocephin in the past despite her penicillin allergy

## 2019-11-26 ENCOUNTER — Ambulatory Visit: Payer: Medicaid Other | Attending: Physician Assistant | Admitting: Physician Assistant

## 2019-11-26 ENCOUNTER — Other Ambulatory Visit: Payer: Self-pay

## 2019-12-03 ENCOUNTER — Ambulatory Visit: Payer: Medicaid Other

## 2019-12-08 ENCOUNTER — Ambulatory Visit: Payer: Medicaid Other

## 2019-12-12 ENCOUNTER — Encounter: Payer: Self-pay | Admitting: Internal Medicine

## 2019-12-30 ENCOUNTER — Other Ambulatory Visit: Payer: Self-pay

## 2019-12-30 ENCOUNTER — Ambulatory Visit: Payer: Medicaid Other | Admitting: Internal Medicine

## 2019-12-31 ENCOUNTER — Ambulatory Visit: Payer: Medicaid Other

## 2021-02-15 ENCOUNTER — Encounter (HOSPITAL_BASED_OUTPATIENT_CLINIC_OR_DEPARTMENT_OTHER): Payer: Self-pay | Admitting: *Deleted

## 2021-02-15 ENCOUNTER — Other Ambulatory Visit: Payer: Self-pay

## 2021-02-15 DIAGNOSIS — Z87891 Personal history of nicotine dependence: Secondary | ICD-10-CM | POA: Insufficient documentation

## 2021-02-15 DIAGNOSIS — M25561 Pain in right knee: Secondary | ICD-10-CM | POA: Diagnosis not present

## 2021-02-15 DIAGNOSIS — M25461 Effusion, right knee: Secondary | ICD-10-CM | POA: Diagnosis not present

## 2021-02-15 DIAGNOSIS — I10 Essential (primary) hypertension: Secondary | ICD-10-CM | POA: Diagnosis not present

## 2021-02-15 DIAGNOSIS — Z7984 Long term (current) use of oral hypoglycemic drugs: Secondary | ICD-10-CM | POA: Diagnosis not present

## 2021-02-15 LAB — PREGNANCY, URINE: Preg Test, Ur: NEGATIVE

## 2021-02-15 NOTE — ED Triage Notes (Signed)
States she has fluid on her right knee since this am. No improvement with elevation. She is ambulatory to triage.

## 2021-02-16 ENCOUNTER — Emergency Department (HOSPITAL_BASED_OUTPATIENT_CLINIC_OR_DEPARTMENT_OTHER)
Admission: EM | Admit: 2021-02-16 | Discharge: 2021-02-16 | Disposition: A | Discharge status: Home / Self Care | Payer: Medicaid Other | Attending: Emergency Medicine | Admitting: Emergency Medicine

## 2021-02-16 DIAGNOSIS — M25461 Effusion, right knee: Secondary | ICD-10-CM

## 2021-02-16 MED ORDER — NAPROXEN 250 MG PO TABS
500.0000 mg | ORAL_TABLET | Freq: Once | ORAL | Status: AC
  Administered 2021-02-16: 500 mg via ORAL
  Filled 2021-02-16: qty 2

## 2021-02-16 MED ORDER — LIDOCAINE-EPINEPHRINE 2 %-1:100000 IJ SOLN: 20.0000 mL | Freq: Once | INTRAMUSCULAR | Status: DC

## 2021-02-16 MED ORDER — NAPROXEN 375 MG PO TABS: ORAL_TABLET | ORAL | 0 refills | Status: DC

## 2021-02-16 MED ORDER — LIDOCAINE-EPINEPHRINE (PF) 2 %-1:200000 IJ SOLN
INTRAMUSCULAR | Status: AC
  Administered 2021-02-16: 20 mL
  Filled 2021-02-16: qty 20

## 2021-02-16 NOTE — ED Provider Notes (Signed)
[facility] Provider Note: Lowella Dell, MD, FACEP  CSN: [id number] MRN: [id number] ARRIVAL: 02/15/21 at 2234 ROOM: MH09/MH09   CHIEF COMPLAINT  Knee Swelling   HISTORY OF PRESENT ILLNESS  02/16/21 1:17 AM Colleen Bradley is a 30 y.o. female who has had pain and swelling of her right knee since yesterday morning.  She rates associated pain as an 8 out of 10, worse with movement or weightbearing.  She denies trauma.  There is no erythema or warmth.  She denies fever or chills.   Past Medical History:  Diagnosis Date   Chlamydia 06/29/2019   Hypertension    Obesity    PCOS (polycystic ovarian syndrome)     Past Surgical History:  Procedure Laterality Date   arm surgery Left    TONSILLECTOMY      Family History  Problem Relation Age of Onset   Hypertension Mother    Diabetes Father    Diabetes Brother    Hypertension Maternal Grandmother     Social History   Tobacco Use   Smoking status: Former    Packs/day: 0.25    Years: 15.00    Pack years: 3.75    Types: Cigarettes   Smokeless tobacco: Never  Vaping Use   Vaping Use: Never used  Substance Use Topics   Alcohol use: Not Currently    Comment: occasionally   Drug use: Not Currently    Prior to Admission medications   Medication Sig Start Date End Date Taking? Authorizing Provider  naproxen (NAPROSYN) 375 MG tablet Take 1 tablet twice daily as needed for knee pain. 02/16/21  Yes Kathlene Yano, MD  Eflornithine HCl 13.9 % cream Apply thin layer to hair under the chin twice daily 06/20/19   Marcine Matar, MD  metFORMIN (GLUCOPHAGE) 500 MG tablet Take 1 tablet by mouth once a day 06/16/15   [provider]    Allergies Penicillins   REVIEW OF SYSTEMS  Negative except as noted here or in the History of Present Illness.   PHYSICAL EXAMINATION  Initial Vital Signs Blood pressure (!) 156/96, pulse 96, temperature 98.2 F (36.8 C), temperature source Oral, resp. rate 18, height 5\' 7"   (1.702 m), last menstrual period 01/07/2021, SpO2 98 %, unknown if currently breastfeeding.  Examination General: Well-developed, well-nourished female in no acute distress; appearance consistent with age of record HENT: normocephalic; atraumatic Eyes: Normal appearance Neck: supple Heart: regular rate and rhythm Lungs: clear to auscultation bilaterally Abdomen: soft; nondistended; nontender; bowel sounds present Extremities: No deformity; tenderness and small effusion of right knee without erythema or warmth Neurologic: Awake, alert and oriented; motor function intact in all extremities and symmetric; no facial droop Skin: Warm and dry Psychiatric: Normal mood and affect   RESULTS  Summary of this visit's results, reviewed and interpreted by myself:   EKG Interpretation  Date/Time:    Ventricular Rate:    PR Interval:    QRS Duration:   QT Interval:    QTC Calculation:   R Axis:     Text Interpretation:         Laboratory Studies: Results for orders placed or performed during the hospital encounter of 02/16/21 (from the past 24 hour(s))  Pregnancy, urine     Status: None   Collection Time: 02/15/21 10:49 PM  Result Value Ref Range   Preg Test, Ur NEGATIVE NEGATIVE   Imaging Studies: No results found.  ED COURSE and MDM  Nursing notes, initial and subsequent vitals signs,  including pulse oximetry, reviewed and interpreted by myself.  Vitals:   02/15/21 2245 02/15/21 2246  BP:  (!) 156/96  Pulse:  96  Resp:  18  Temp:  98.2 F (36.8 C)  TempSrc:  Oral  SpO2:  98%  Height: 5\' 7"  (1.702 m)    Medications  lidocaine-EPINEPHrine (XYLOCAINE W/EPI) 2 %-1:100000 (with pres) injection 20 mL (has no administration in time range)  lidocaine-EPINEPHrine (XYLOCAINE W/EPI) 2 %-1:200000 (PF) injection (has no administration in time range)  naproxen (NAPROSYN) tablet 500 mg (has no administration in time range)    Unable to aspirate any fluid for analysis.  We will  place the patient in a knee brace and treat with an anti-inflammatory.  We will refer to orthopedics for further evaluation and treatment.  PROCEDURES  .Joint Aspiration/Arthrocentesis  Date/Time: 02/16/2021 1:33 AM Performed by: Mattew Chriswell, MD Authorized by: Nikka Hakimian, MD   Consent:    Consent obtained:  Verbal   Consent given by:  Patient   Risks, benefits, and alternatives were discussed: yes     Risks discussed:  Bleeding, pain, infection and incomplete drainage Universal protocol:    Required blood products, implants, devices, and special equipment available: yes     Immediately prior to procedure, a time out was called: yes     Patient identity confirmed:  Arm band and verbally with patient Location:    Location:  Knee   Knee:  R knee Anesthesia:    Anesthesia method:  Local infiltration   Local anesthetic:  Lidocaine 2% WITH epi Procedure details:    Preparation: Patient was prepped and draped in usual sterile fashion     Needle gauge:  18 G   Ultrasound guidance: no     Approach:  Medial   Aspirate amount:  None   Steroid injected: no     Specimen collected: no   Post-procedure details:    Procedure completion:  Tolerated well, no immediate complications   ED DIAGNOSES     ICD-10-CM   1. Knee effusion, right  M25.461          Jaycub Noorani, 13/05/2020, MD 02/16/21 [phone]

## 2021-02-17 ENCOUNTER — Telehealth: Payer: Self-pay

## 2021-02-17 NOTE — Telephone Encounter (Signed)
Transition Care Management Unsuccessful Follow-up Telephone Call  Date of discharge and from where:  02/16/2021 from [facility]  Attempts:  1st Attempt  Reason for unsuccessful TCM follow-up call:  Left voice message

## 2021-02-18 NOTE — Telephone Encounter (Signed)
Transition Care Management Unsuccessful Follow-up Telephone Call  Date of discharge and from where:  02/16/21 from [facility]  Attempts:  2nd Attempt  Reason for unsuccessful TCM follow-up call:  Left voice message

## 2021-02-21 NOTE — Telephone Encounter (Signed)
Transition Care Management Unsuccessful Follow-up Telephone Call  Date of discharge and from where:  02/16/2021 from [facility]  Attempts:  3rd Attempt  Reason for unsuccessful TCM follow-up call:  Unable to reach patient

## 2021-02-25 ENCOUNTER — Other Ambulatory Visit: Payer: Self-pay

## 2021-02-25 ENCOUNTER — Encounter (HOSPITAL_COMMUNITY): Payer: Self-pay | Admitting: Obstetrics and Gynecology

## 2021-02-25 ENCOUNTER — Inpatient Hospital Stay (HOSPITAL_COMMUNITY)
Admission: AD | Admit: 2021-02-25 | Discharge: 2021-02-25 | Disposition: A | Discharge status: Home / Self Care | Payer: Medicaid Other | Attending: Obstetrics and Gynecology | Admitting: Obstetrics and Gynecology

## 2021-02-25 ENCOUNTER — Inpatient Hospital Stay (HOSPITAL_COMMUNITY): Payer: Medicaid Other

## 2021-02-25 DIAGNOSIS — R109 Unspecified abdominal pain: Secondary | ICD-10-CM | POA: Insufficient documentation

## 2021-02-25 DIAGNOSIS — O26891 Other specified pregnancy related conditions, first trimester: Secondary | ICD-10-CM | POA: Diagnosis not present

## 2021-02-25 DIAGNOSIS — Z3A01 Less than 8 weeks gestation of pregnancy: Secondary | ICD-10-CM | POA: Insufficient documentation

## 2021-02-25 DIAGNOSIS — O99891 Other specified diseases and conditions complicating pregnancy: Secondary | ICD-10-CM

## 2021-02-25 LAB — URINALYSIS, ROUTINE W REFLEX MICROSCOPIC
Bilirubin Urine: NEGATIVE
Glucose, UA: NEGATIVE mg/dL
Ketones, ur: NEGATIVE mg/dL
Leukocytes,Ua: NEGATIVE
Nitrite: NEGATIVE
Protein, ur: NEGATIVE mg/dL
Specific Gravity, Urine: 1.021 (ref 1.005–1.030)
pH: 8 (ref 5.0–8.0)

## 2021-02-25 LAB — POCT PREGNANCY, URINE: Preg Test, Ur: POSITIVE — AB

## 2021-02-25 LAB — WET PREP, GENITAL
Clue Cells Wet Prep HPF POC: NONE SEEN
Sperm: NONE SEEN
Trich, Wet Prep: NONE SEEN
Yeast Wet Prep HPF POC: NONE SEEN

## 2021-02-25 LAB — CBC
HCT: 39.2 % (ref 36.0–46.0)
Hemoglobin: 13.5 g/dL (ref 12.0–15.0)
MCH: 30.5 pg (ref 26.0–34.0)
MCHC: 34.4 g/dL (ref 30.0–36.0)
MCV: 88.5 fL (ref 80.0–100.0)
Platelets: 405 10*3/uL — ABNORMAL HIGH (ref 150–400)
RBC: 4.43 MIL/uL (ref 3.87–5.11)
RDW: 12.9 % (ref 11.5–15.5)
WBC: 10.9 10*3/uL — ABNORMAL HIGH (ref 4.0–10.5)
nRBC: 0 % (ref 0.0–0.2)

## 2021-02-25 LAB — HCG, QUANTITATIVE, PREGNANCY: hCG, Beta Chain, Quant, S: 2819 m[IU]/mL — ABNORMAL HIGH (ref <5)

## 2021-02-25 NOTE — Discharge Instructions (Signed)
Return to care  If you have heavier bleeding that soaks through more than 2 pads per hour for an hour or more If you bleed so much that you feel like you might pass out or you do pass out If you have significant abdominal pain that is not improved with Tylenol     [facility] Providers          [facility]  [address], [place], [place] [address]  [phone]  [facility]  [address], [place], [place] [address]  [phone]  [facility]  [address], [place], [place] [address]  [phone]  [facility] [place] [facility]  [address], [place], [place] [address]  [phone]  [facility]  [address] floor), [place], [place] [address]  [phone]  [facility] [address], [place], [place] [address] [phone]  [facility]  [address], [place], [place] [address]  [phone]  [facility]  [address], [place], [place] [address]  [phone]  [facility]  [address], [place], [place] [address]  [phone]  [facility]  [address], [place], [place] [address]  [phone]  [facility]  [address], [place], [place] [address]  [phone]  [facility]  [address], [place], [place] [address]  [phone]  [facility]   [address], [place], [place] [address]  [phone]  [facility]  [address], [place], [place] [address]   [phone]  [facility]  [address], , [place] [address]  [phone]         

## 2021-02-25 NOTE — MAU Provider Note (Signed)
History     CSN: [id number]  Arrival date and time: 02/25/21 1606   Event Date/Time   First Provider Initiated Contact with Patient 02/25/21 1731      Chief Complaint  Patient presents with   Abdominal Pain   HPI Colleen Bradley is a 30 y.o. G2P0010 at [redacted]w[redacted]d who presents with abdominal pain. Reports intermittent cramping throughout lower abdomen that started last week. Rates pain 8/10. Hasn't treated symptoms. Nothing makes better or worse. Denies n/v/d, fever, dysuria, vaginal bleeding, or vaginal discharge. Plans on going to [facility] for prenatal care.   OB History     Gravida  2   Para  0   Term      Preterm      AB  1   Living  0      SAB      IAB  1   Ectopic      Multiple      Live Births  0           Past Medical History:  Diagnosis Date   Chlamydia 06/29/2019   Hypertension    Obesity    PCOS (polycystic ovarian syndrome)     Past Surgical History:  Procedure Laterality Date   arm surgery Left    TONSILLECTOMY      Family History  Problem Relation Age of Onset   Cancer Mother    Hypertension Mother    Diabetes Father    Diabetes Brother    Hypertension Maternal Grandmother     Social History   Tobacco Use   Smoking status: Former    Packs/day: 0.25    Years: 15.00    Pack years: 3.75    Types: Cigarettes   Smokeless tobacco: Never  Vaping Use   Vaping Use: Never used  Substance Use Topics   Alcohol use: Not Currently    Comment: occasionally   Drug use: Not Currently    Allergies:  Allergies  Allergen Reactions   Penicillins Hives and Itching    Has patient had a PCN reaction causing immediate rash, facial/tongue/throat swelling, SOB or lightheadedness with hypotension: Yes Has patient had a PCN reaction causing severe rash involving mucus membranes or skin necrosis: No Has patient had a PCN reaction that required hospitalization Yes Has patient had a PCN reaction occurring within the last 10 years: No If all of the  above answers are "NO", then may proceed with Cephalosporin use.     Medications Prior to Admission  Medication Sig Dispense Refill Last Dose   Multiple Vitamin (MULTIVITAMIN) tablet Take 1 tablet by mouth daily.   02/24/2021   Eflornithine HCl 13.9 % cream Apply thin layer to hair under the chin twice daily 45 g 1    metFORMIN (GLUCOPHAGE) 500 MG tablet Take 1 tablet by mouth once a day  0    naproxen (NAPROSYN) 375 MG tablet Take 1 tablet twice daily as needed for knee pain. 20 tablet 0     Review of Systems  Constitutional: Negative.   Gastrointestinal:  Positive for abdominal pain. Negative for diarrhea, nausea and vomiting.  Genitourinary: Negative.   Physical Exam   Blood pressure 132/75, pulse 94, temperature 98.6 F (37 C), temperature source Oral, resp. rate 19, height 5\' 7"  (1.702 m), weight 125.3 kg, last menstrual period 01/20/2021, SpO2 99 %, unknown if currently breastfeeding.  Physical Exam Vitals and nursing note reviewed.  Constitutional:      Appearance: She is well-developed. She is not  ill-appearing.  HENT:     Head: Normocephalic and atraumatic.  Eyes:     General: No scleral icterus. Pulmonary:     Effort: Pulmonary effort is normal. No respiratory distress.  Abdominal:     General: There is no distension.     Palpations: Abdomen is soft.     Tenderness: There is no abdominal tenderness. There is no guarding or rebound.  Skin:    General: Skin is warm and dry.  Neurological:     General: No focal deficit present.     Mental Status: She is alert.  Psychiatric:        Mood and Affect: Mood normal.        Behavior: Behavior normal.    MAU Course  Procedures Results for orders placed or performed during the hospital encounter of 02/25/21 (from the past 24 hour(s))  Urinalysis, Routine w reflex microscopic Urine, Clean Catch     Status: Abnormal   Collection Time: 02/25/21  4:06 PM  Result Value Ref Range   Color, Urine YELLOW YELLOW   APPearance  CLOUDY (A) CLEAR   Specific Gravity, Urine 1.021 1.005 - 1.030   pH 8.0 5.0 - 8.0   Glucose, UA NEGATIVE NEGATIVE mg/dL   Hgb urine dipstick SMALL (A) NEGATIVE   Bilirubin Urine NEGATIVE NEGATIVE   Ketones, ur NEGATIVE NEGATIVE mg/dL   Protein, ur NEGATIVE NEGATIVE mg/dL   Nitrite NEGATIVE NEGATIVE   Leukocytes,Ua NEGATIVE NEGATIVE   RBC / HPF 6-10 0 - 5 RBC/hpf   WBC, UA 0-5 0 - 5 WBC/hpf   Bacteria, UA FEW (A) NONE SEEN   Squamous Epithelial / LPF 11-20 0 - 5   Mucus PRESENT   Pregnancy, urine POC     Status: Abnormal   Collection Time: 02/25/21  4:50 PM  Result Value Ref Range   Preg Test, Ur POSITIVE (A) NEGATIVE  CBC     Status: Abnormal   Collection Time: 02/25/21  5:46 PM  Result Value Ref Range   WBC 10.9 (H) 4.0 - 10.5 K/uL   RBC 4.43 3.87 - 5.11 MIL/uL   Hemoglobin 13.5 12.0 - 15.0 g/dL   HCT 93.2 67.1 - 24.5 %   MCV 88.5 80.0 - 100.0 fL   MCH 30.5 26.0 - 34.0 pg   MCHC 34.4 30.0 - 36.0 g/dL   RDW 80.9 98.3 - 38.2 %   Platelets 405 (H) 150 - 400 K/uL   nRBC 0.0 0.0 - 0.2 %  hCG, quantitative, pregnancy     Status: Abnormal   Collection Time: 02/25/21  5:46 PM  Result Value Ref Range   hCG, Beta Chain, Quant, S 2,819 (H) <5 mIU/mL  Wet prep, genital     Status: Abnormal   Collection Time: 02/25/21  5:54 PM  Result Value Ref Range   Yeast Wet Prep HPF POC NONE SEEN NONE SEEN   Trich, Wet Prep NONE SEEN NONE SEEN   Clue Cells Wet Prep HPF POC NONE SEEN NONE SEEN   WBC, Wet Prep HPF POC FEW (A) NONE SEEN   Sperm NONE SEEN    US OB LESS THAN 14 WEEKS WITH OB TRANSVAGINAL  Result Date: 02/25/2021 CLINICAL DATA:  Abdominal pain in 1st trimester pregnancy. EXAM: OBSTETRIC <14 WK [place] AND TRANSVAGINAL OB US TECHNIQUE: Both transabdominal and transvaginal ultrasound examinations were performed for complete evaluation of the gestation as well as the maternal uterus, adnexal regions, and pelvic cul-de-sac. Transvaginal technique was performed to assess early pregnancy.  COMPARISON:  None. FINDINGS: Intrauterine gestational sac: Single, with double decidual sac sign noted Yolk sac:  Not Visualized. Embryo:  Not Visualized. MSD: 5 mm   5 w   1 d Subchorionic hemorrhage:  None visualized. Maternal uterus/adnexae: Both ovaries are normal in appearance. Small left ovarian corpus luteum noted. No adnexal mass or abnormal free fluid identified. IMPRESSION: Single early approximately 5 week intrauterine gestational sac. Consider correlation with serial b-hCG levels, and followup ultrasound to assess viability in 14 days. No evidence of adnexal mass or free fluid. Electronically Signed   By: Danae Orleans M.D.   On: 02/25/2021 18:35    MDM +UPT UA, wet prep, GC/chlamydia, CBC, ABO/Rh, quant hCG, and [place] today to rule out ectopic pregnancy which can be life threatening.   Ultrasound shows IUGS with decidual sign. No YS or embryo. Reviewed with Dr. Macon Large - early IUP, f/u viability ultrasound since patient plans on going to [facility].   Assessment and Plan   1. Abdominal pain during pregnancy in first trimester   2. [redacted] weeks gestation of pregnancy    -f/u outpatient viability scan -GC/CT pending -reviewed reasons to return to MAU  Judeth Horn 02/25/2021, 5:31 PM

## 2021-02-25 NOTE — MAU Note (Signed)
Presents with c/o abdominal cramping x2 weeks.  Denies VB.  States +HPT.  LMP 01/20/2021.

## 2021-02-28 LAB — GC/CHLAMYDIA PROBE AMP (~~LOC~~) NOT AT ARMC
Chlamydia: NEGATIVE
Comment: NEGATIVE
Comment: NORMAL
Neisseria Gonorrhea: NEGATIVE

## 2021-03-11 ENCOUNTER — Inpatient Hospital Stay (HOSPITAL_COMMUNITY)
Admission: AD | Admit: 2021-03-11 | Discharge: 2021-03-11 | Disposition: A | Discharge status: Home / Self Care | Payer: Medicaid Other | Attending: Obstetrics and Gynecology | Admitting: Obstetrics and Gynecology

## 2021-03-11 ENCOUNTER — Encounter (HOSPITAL_COMMUNITY): Payer: Self-pay | Admitting: Obstetrics and Gynecology

## 2021-03-11 ENCOUNTER — Other Ambulatory Visit: Payer: Self-pay

## 2021-03-11 DIAGNOSIS — O219 Vomiting of pregnancy, unspecified: Secondary | ICD-10-CM | POA: Diagnosis not present

## 2021-03-11 DIAGNOSIS — Z3A01 Less than 8 weeks gestation of pregnancy: Secondary | ICD-10-CM | POA: Diagnosis not present

## 2021-03-11 LAB — URINALYSIS, ROUTINE W REFLEX MICROSCOPIC
Bacteria, UA: NONE SEEN
Bilirubin Urine: NEGATIVE
Glucose, UA: NEGATIVE mg/dL
Ketones, ur: 20 mg/dL — AB
Leukocytes,Ua: NEGATIVE
Nitrite: NEGATIVE
Protein, ur: NEGATIVE mg/dL
Specific Gravity, Urine: 1.009 (ref 1.005–1.030)
pH: 6 (ref 5.0–8.0)

## 2021-03-11 MED ORDER — FAMOTIDINE IN NACL 20-0.9 MG/50ML-% IV SOLN
20.0000 mg | Freq: Once | INTRAVENOUS | Status: AC
  Administered 2021-03-11: 20 mg via INTRAVENOUS
  Filled 2021-03-11: qty 50

## 2021-03-11 MED ORDER — ONDANSETRON HCL 4 MG/2ML IJ SOLN
4.0000 mg | Freq: Once | INTRAMUSCULAR | Status: AC
  Administered 2021-03-11: 4 mg via INTRAVENOUS
  Filled 2021-03-11: qty 2

## 2021-03-11 MED ORDER — LACTATED RINGERS IV BOLUS
1000.0000 mL | Freq: Once | INTRAVENOUS | Status: AC
  Administered 2021-03-11: 1000 mL via INTRAVENOUS

## 2021-03-11 MED ORDER — PROMETHAZINE HCL 25 MG PO TABS: 25.0000 mg | ORAL_TABLET | Freq: Four times a day (QID) | ORAL | 2 refills | Status: DC | PRN

## 2021-03-11 MED ORDER — ONDANSETRON 8 MG PO TBDP: 8.0000 mg | ORAL_TABLET | Freq: Three times a day (TID) | ORAL | 2 refills | Status: DC | PRN

## 2021-03-11 MED ORDER — FAMOTIDINE 20 MG PO TABS: 20.0000 mg | ORAL_TABLET | Freq: Two times a day (BID) | ORAL | 2 refills | Status: AC

## 2021-03-11 NOTE — MAU Provider Note (Signed)
History     CSN: [id number]  Arrival date and time: 03/11/21 1400   Event Date/Time   First Provider Initiated Contact with Patient 03/11/21 1427      Chief Complaint  Patient presents with   Nausea   HPI Colleen Bradley is a 30 y.o. G2P0010 at [redacted]w[redacted]d who presents with nausea and vomiting. She states she has been throwing up for days and has thrown up 8 times in the last 24 hours. She has tried ginger chews and ginger candies without relief. She denies any pain bleeding.  OB History     Gravida  2   Para  0   Term      Preterm      AB  1   Living  0      SAB      IAB  1   Ectopic      Multiple      Live Births  0           Past Medical History:  Diagnosis Date   Chlamydia 06/29/2019   Hypertension    Obesity    PCOS (polycystic ovarian syndrome)     Past Surgical History:  Procedure Laterality Date   arm surgery Left    TONSILLECTOMY      Family History  Problem Relation Age of Onset   Cancer Mother    Hypertension Mother    Diabetes Father    Diabetes Brother    Hypertension Maternal Grandmother     Social History   Tobacco Use   Smoking status: Former    Packs/day: 0.25    Years: 15.00    Pack years: 3.75    Types: Cigarettes   Smokeless tobacco: Never  Vaping Use   Vaping Use: Never used  Substance Use Topics   Alcohol use: Not Currently    Comment: occasionally   Drug use: Not Currently    Allergies:  Allergies  Allergen Reactions   Penicillins Hives and Itching    Has patient had a PCN reaction causing immediate rash, facial/tongue/throat swelling, SOB or lightheadedness with hypotension: Yes Has patient had a PCN reaction causing severe rash involving mucus membranes or skin necrosis: No Has patient had a PCN reaction that required hospitalization Yes Has patient had a PCN reaction occurring within the last 10 years: No If all of the above answers are "NO", then may proceed with Cephalosporin use.     No medications  prior to admission.    Review of Systems  Constitutional: Negative.  Negative for fatigue and fever.  HENT: Negative.    Respiratory: Negative.  Negative for shortness of breath.   Cardiovascular: Negative.  Negative for chest pain.  Gastrointestinal:  Positive for nausea and vomiting. Negative for abdominal pain, constipation and diarrhea.  Genitourinary: Negative.  Negative for dysuria, vaginal bleeding and vaginal discharge.  Neurological: Negative.  Negative for dizziness and headaches.  Physical Exam   Blood pressure (!) 143/82, pulse 91, temperature 98.8 F (37.1 C), temperature source Oral, resp. rate 17, height 5\' 7"  (1.702 m), weight 123.2 kg, last menstrual period 01/20/2021, SpO2 100 %, unknown if currently breastfeeding.  Physical Exam Vitals and nursing note reviewed.  Constitutional:      General: She is not in acute distress.    Appearance: She is well-developed.  HENT:     Head: Normocephalic.  Eyes:     Pupils: Pupils are equal, round, and reactive to light.  Cardiovascular:     Rate  and Rhythm: Normal rate and regular rhythm.     Heart sounds: Normal heart sounds.  Pulmonary:     Effort: Pulmonary effort is normal. No respiratory distress.     Breath sounds: Normal breath sounds.  Abdominal:     General: Bowel sounds are normal. There is no distension.     Palpations: Abdomen is soft.     Tenderness: There is no abdominal tenderness.  Skin:    General: Skin is warm and dry.  Neurological:     Mental Status: She is alert and oriented to person, place, and time.  Psychiatric:        Mood and Affect: Mood normal.        Behavior: Behavior normal.        Thought Content: Thought content normal.        Judgment: Judgment normal.    MAU Course  Procedures  MDM UA- unable to leave urine sample LR bolus Zofran IV Pepcid IV  Patient reports resolution of nausea  Assessment and Plan   1. Nausea and vomiting during pregnancy   2. [redacted] weeks gestation of  pregnancy    -Discharge home in stable condition -Rx for zofran, pepcid, phenergan sent to patient's pharmacy -First trimester precautions discussed -Patient advised to follow-up with OB as scheduled for prenatal care -Patient may return to MAU as needed or if her condition were to change or worsen   Rolm Bookbinder CNM 03/11/2021, 2:27 PM

## 2021-03-11 NOTE — Discharge Instructions (Signed)

## 2021-03-11 NOTE — MAU Note (Signed)
Colleen Bradley is a 30 y.o. at [redacted]w[redacted]d here in MAU reporting: nausea and vomiting has been getting worse for the past week. 8 episodes of emesis in the past 24 hours. No pain or bleeding.  Onset of complaint: ongoing  Pain score: 0/10  Vitals:   03/11/21 1412  BP: (!) 151/82  Pulse: 90  Resp: 20  Temp: 97.6 F (36.4 C)  SpO2: 100%     Lab orders placed from triage: UA

## 2021-03-15 ENCOUNTER — Ambulatory Visit: Payer: Medicaid Other

## 2021-03-16 ENCOUNTER — Other Ambulatory Visit: Payer: Self-pay

## 2021-03-16 ENCOUNTER — Ambulatory Visit
Admission: RE | Admit: 2021-03-16 | Discharge: 2021-03-16 | Disposition: A | Discharge status: Home / Self Care | Payer: Medicaid Other | Source: Ambulatory Visit | Attending: Student | Admitting: Student

## 2021-03-16 ENCOUNTER — Telehealth: Payer: Self-pay | Admitting: Obstetrics and Gynecology

## 2021-03-16 DIAGNOSIS — R109 Unspecified abdominal pain: Secondary | ICD-10-CM | POA: Diagnosis not present

## 2021-03-16 DIAGNOSIS — Z3A01 Less than 8 weeks gestation of pregnancy: Secondary | ICD-10-CM | POA: Insufficient documentation

## 2021-03-16 DIAGNOSIS — O26891 Other specified pregnancy related conditions, first trimester: Secondary | ICD-10-CM | POA: Diagnosis not present

## 2021-03-16 DIAGNOSIS — O208 Other hemorrhage in early pregnancy: Secondary | ICD-10-CM | POA: Diagnosis not present

## 2021-03-16 NOTE — Telephone Encounter (Signed)
Attempted to call patient to review [place] results. No answer. Message left call MAU.   Venia Carbon I, NP 03/16/2021 2:00 PM

## 2021-03-18 ENCOUNTER — Inpatient Hospital Stay (HOSPITAL_COMMUNITY)
Admission: AD | Admit: 2021-03-18 | Discharge: 2021-03-19 | Discharge status: Against Medical Advice | Payer: Medicaid Other | Attending: Obstetrics & Gynecology | Admitting: Obstetrics & Gynecology

## 2021-03-18 ENCOUNTER — Other Ambulatory Visit: Payer: Self-pay

## 2021-03-18 DIAGNOSIS — R1084 Generalized abdominal pain: Secondary | ICD-10-CM | POA: Insufficient documentation

## 2021-03-18 DIAGNOSIS — O210 Mild hyperemesis gravidarum: Secondary | ICD-10-CM | POA: Insufficient documentation

## 2021-03-18 DIAGNOSIS — E860 Dehydration: Secondary | ICD-10-CM | POA: Insufficient documentation

## 2021-03-18 DIAGNOSIS — Z3A08 8 weeks gestation of pregnancy: Secondary | ICD-10-CM | POA: Insufficient documentation

## 2021-03-18 DIAGNOSIS — O99281 Endocrine, nutritional and metabolic diseases complicating pregnancy, first trimester: Secondary | ICD-10-CM | POA: Insufficient documentation

## 2021-03-18 DIAGNOSIS — O26891 Other specified pregnancy related conditions, first trimester: Secondary | ICD-10-CM | POA: Insufficient documentation

## 2021-03-18 DIAGNOSIS — O0931 Supervision of pregnancy with insufficient antenatal care, first trimester: Secondary | ICD-10-CM | POA: Insufficient documentation

## 2021-03-18 NOTE — MAU Note (Signed)
..  Colleen Bradley is a 30 y.o. at [redacted]w[redacted]d here in MAU reporting: "needs fluids or something" Has not eaten or drank anything in a week, keep throwing up all day. Reports headache and abdominal pain that she thinks its from her throwing up so much. These symptoms have been going on for a week.  Denies vaginal bleeding or leaking of fluid.   Pain score: 10/10 Vitals:   03/18/21 2324  BP: (!) 141/90  Pulse: 100  Resp: 18  Temp: 97.9 F (36.6 C)  SpO2: 100%   Lab orders placed from triage: UA

## 2021-03-19 ENCOUNTER — Other Ambulatory Visit: Payer: Self-pay

## 2021-03-19 ENCOUNTER — Encounter (HOSPITAL_COMMUNITY): Payer: Self-pay | Admitting: Obstetrics & Gynecology

## 2021-03-19 ENCOUNTER — Inpatient Hospital Stay (EMERGENCY_DEPARTMENT_HOSPITAL)
Admission: AD | Admit: 2021-03-19 | Discharge: 2021-03-19 | Disposition: A | Discharge status: Home / Self Care | Payer: Medicaid Other | Source: Home / Self Care | Attending: Obstetrics & Gynecology | Admitting: Obstetrics & Gynecology

## 2021-03-19 DIAGNOSIS — R000 Tachycardia, unspecified: Secondary | ICD-10-CM | POA: Diagnosis not present

## 2021-03-19 DIAGNOSIS — Z3A08 8 weeks gestation of pregnancy: Secondary | ICD-10-CM

## 2021-03-19 DIAGNOSIS — O210 Mild hyperemesis gravidarum: Secondary | ICD-10-CM

## 2021-03-19 DIAGNOSIS — O99281 Endocrine, nutritional and metabolic diseases complicating pregnancy, first trimester: Secondary | ICD-10-CM | POA: Insufficient documentation

## 2021-03-19 DIAGNOSIS — O0931 Supervision of pregnancy with insufficient antenatal care, first trimester: Secondary | ICD-10-CM | POA: Insufficient documentation

## 2021-03-19 DIAGNOSIS — I10 Essential (primary) hypertension: Secondary | ICD-10-CM | POA: Diagnosis not present

## 2021-03-19 DIAGNOSIS — O26891 Other specified pregnancy related conditions, first trimester: Secondary | ICD-10-CM | POA: Insufficient documentation

## 2021-03-19 DIAGNOSIS — R110 Nausea: Secondary | ICD-10-CM | POA: Diagnosis not present

## 2021-03-19 DIAGNOSIS — G4489 Other headache syndrome: Secondary | ICD-10-CM | POA: Diagnosis not present

## 2021-03-19 DIAGNOSIS — Z87891 Personal history of nicotine dependence: Secondary | ICD-10-CM | POA: Insufficient documentation

## 2021-03-19 DIAGNOSIS — E860 Dehydration: Secondary | ICD-10-CM

## 2021-03-19 DIAGNOSIS — R1084 Generalized abdominal pain: Secondary | ICD-10-CM | POA: Insufficient documentation

## 2021-03-19 LAB — URINALYSIS, ROUTINE W REFLEX MICROSCOPIC
Bilirubin Urine: NEGATIVE
Glucose, UA: 50 mg/dL — AB
Glucose, UA: NEGATIVE mg/dL
Ketones, ur: 80 mg/dL — AB
Ketones, ur: 80 mg/dL — AB
Leukocytes,Ua: NEGATIVE
Leukocytes,Ua: NEGATIVE
Nitrite: NEGATIVE
Nitrite: NEGATIVE
Protein, ur: >=300 mg/dL — AB
Protein, ur: >=300 mg/dL — AB
Specific Gravity, Urine: 1.027 (ref 1.005–1.030)
Specific Gravity, Urine: 1.029 (ref 1.005–1.030)
Squamous Epithelial / HPF: >50 — ABNORMAL HIGH (ref 0–5)
pH: 5 (ref 5.0–8.0)
pH: 5 (ref 5.0–8.0)

## 2021-03-19 LAB — COMPREHENSIVE METABOLIC PANEL
ALT: 40 U/L (ref 0–44)
AST: 29 U/L (ref 15–41)
Albumin: 4.1 g/dL (ref 3.5–5.0)
Alkaline Phosphatase: 47 U/L (ref 38–126)
Anion gap: 14 (ref 5–15)
BUN: 8 mg/dL (ref 6–20)
CO2: 24 mmol/L (ref 22–32)
Calcium: 9.6 mg/dL (ref 8.9–10.3)
Chloride: 97 mmol/L — ABNORMAL LOW (ref 98–111)
Creatinine, Ser: 0.99 mg/dL (ref 0.44–1.00)
GFR, Estimated: >60 mL/min (ref >60)
Glucose, Bld: 117 mg/dL — ABNORMAL HIGH (ref 70–99)
Potassium: 3.9 mmol/L (ref 3.5–5.1)
Sodium: 135 mmol/L (ref 135–145)
Total Bilirubin: 1.2 mg/dL (ref 0.3–1.2)
Total Protein: 7.9 g/dL (ref 6.5–8.1)

## 2021-03-19 LAB — CBC WITH DIFFERENTIAL/PLATELET
Abs Immature Granulocytes: 0.03 10*3/uL (ref 0.00–0.07)
Basophils Absolute: 0 10*3/uL (ref 0.0–0.1)
Basophils Relative: 0 %
Eosinophils Absolute: 0 10*3/uL (ref 0.0–0.5)
Eosinophils Relative: 0 %
HCT: 44.6 % (ref 36.0–46.0)
Hemoglobin: 15.4 g/dL — ABNORMAL HIGH (ref 12.0–15.0)
Immature Granulocytes: 0 %
Lymphocytes Relative: 24 %
Lymphs Abs: 3 10*3/uL (ref 0.7–4.0)
MCH: 29.7 pg (ref 26.0–34.0)
MCHC: 34.5 g/dL (ref 30.0–36.0)
MCV: 85.9 fL (ref 80.0–100.0)
Monocytes Absolute: 0.8 10*3/uL (ref 0.1–1.0)
Monocytes Relative: 7 %
Neutro Abs: 8.4 10*3/uL — ABNORMAL HIGH (ref 1.7–7.7)
Neutrophils Relative %: 69 %
Platelets: 425 10*3/uL — ABNORMAL HIGH (ref 150–400)
RBC: 5.19 MIL/uL — ABNORMAL HIGH (ref 3.87–5.11)
RDW: 12.2 % (ref 11.5–15.5)
WBC: 12.2 10*3/uL — ABNORMAL HIGH (ref 4.0–10.5)
nRBC: 0 % (ref 0.0–0.2)

## 2021-03-19 LAB — RAPID URINE DRUG SCREEN, HOSP PERFORMED
Amphetamines: NOT DETECTED
Barbiturates: NOT DETECTED
Benzodiazepines: NOT DETECTED
Cocaine: NOT DETECTED
Opiates: NOT DETECTED
Tetrahydrocannabinol: POSITIVE — AB

## 2021-03-19 MED ORDER — METOCLOPRAMIDE HCL 10 MG PO TABS: 10.0000 mg | ORAL_TABLET | Freq: Four times a day (QID) | ORAL | 5 refills | Status: AC | PRN

## 2021-03-19 MED ORDER — LACTATED RINGERS IV BOLUS
1000.0000 mL | Freq: Once | INTRAVENOUS | Status: AC
  Administered 2021-03-19: 1000 mL via INTRAVENOUS

## 2021-03-19 MED ORDER — METOCLOPRAMIDE HCL 5 MG/ML IJ SOLN
10.0000 mg | Freq: Once | INTRAMUSCULAR | Status: AC
  Administered 2021-03-19: 10 mg via INTRAVENOUS
  Filled 2021-03-19: qty 2

## 2021-03-19 MED ORDER — METOCLOPRAMIDE HCL 10 MG PO TABS: 10.0000 mg | ORAL_TABLET | Freq: Four times a day (QID) | ORAL | 5 refills | Status: DC | PRN

## 2021-03-19 MED ORDER — FAMOTIDINE IN NACL 20-0.9 MG/50ML-% IV SOLN
20.0000 mg | Freq: Once | INTRAVENOUS | Status: AC
  Administered 2021-03-19: 20 mg via INTRAVENOUS
  Filled 2021-03-19: qty 50

## 2021-03-19 MED ORDER — ONDANSETRON HCL 4 MG/2ML IJ SOLN
4.0000 mg | Freq: Once | INTRAMUSCULAR | Status: AC
  Administered 2021-03-19: 4 mg via INTRAVENOUS
  Filled 2021-03-19: qty 2

## 2021-03-19 NOTE — Discharge Instructions (Signed)
DO NOT wait two weeks without eating/drinking before returning to MAU if this persists. Come back if unable to keep down food/fluids for more than 3 days. We can get you into the infusion clinic for regular IV fluids/IV meds if this continues which helps a lot of patients with hyperemesis.

## 2021-03-19 NOTE — MAU Provider Note (Signed)
Chief Complaint:  Abdominal Pain, Nausea, and Emesis   Event Date/Time   First Provider Initiated Contact with Patient 03/19/21 1424     HPI: Colleen Bradley is a 30 y.o. G2P0010 at [redacted]w[redacted]d who presents to maternity admissions reporting severe nausea and vomiting x2wks (since her last MAU visit for the same complaint). She has zofran and phenergan prescribed but they do not work since she cannot keep anything down and begins heaving within minutes of swallowing food or fluids. Was pregnant 103yrs ago and had this same problem so severely she terminated the pregnancy. Wants to continue this pregnancy but is considering termination because of how awful she feels. Began having abdominal pain (mostly upper and generalized - not lower) yesterday, with vomiting. Denies vaginal bleeding or discharge, pelvic pain or cramping, fever, falls or any sick contacts.  Pregnancy Course: Has not begun prenatal care yet, has visited MAU twice this pregnancy, once for HE, once for abdominal pain which proved an IUP with cardiac activity. Presented last night for this but left after waiting 5hrs.  Past Medical History:  Diagnosis Date   Chlamydia 06/29/2019   Hypertension    Obesity    PCOS (polycystic ovarian syndrome)    OB History  Gravida Para Term Preterm AB Living  2 0     1 0  SAB IAB Ectopic Multiple Live Births    1     0    # Outcome Date GA Lbr Len/2nd Weight Sex Delivery Anes PTL Lv  2 Current           1 IAB            Past Surgical History:  Procedure Laterality Date   arm surgery Left    TONSILLECTOMY     Family History  Problem Relation Age of Onset   Cancer Mother    Hypertension Mother    Diabetes Father    Diabetes Brother    Hypertension Maternal Grandmother    Social History   Tobacco Use   Smoking status: Former    Packs/day: 0.25    Years: 15.00    Pack years: 3.75    Types: Cigarettes   Smokeless tobacco: Never  Vaping Use   Vaping Use: Never used  Substance Use Topics    Alcohol use: Not Currently    Comment: occasionally   Drug use: Not Currently   Allergies  Allergen Reactions   Penicillins Hives and Itching    Has patient had a PCN reaction causing immediate rash, facial/tongue/throat swelling, SOB or lightheadedness with hypotension: Yes Has patient had a PCN reaction causing severe rash involving mucus membranes or skin necrosis: No Has patient had a PCN reaction that required hospitalization Yes Has patient had a PCN reaction occurring within the last 10 years: No If all of the above answers are "NO", then may proceed with Cephalosporin use.    No medications prior to admission.   I have reviewed patient's Past Medical Hx, Surgical Hx, Family Hx, Social Hx, medications and allergies.   ROS:  Review of Systems  Constitutional:  Positive for fatigue. Negative for chills and fever.  HENT:  Negative for congestion and sore throat.   Eyes:  Negative for photophobia and visual disturbance.  Respiratory:  Negative for cough and shortness of breath.   Cardiovascular:  Negative for chest pain.  Gastrointestinal:  Positive for abdominal pain, nausea and vomiting.  Genitourinary:  Positive for hematuria (vomits after voiding, straining but has not noted any  vaginal bleeding). Negative for vaginal bleeding and vaginal discharge.  Musculoskeletal:  Negative for back pain.  Neurological:  Positive for weakness and light-headedness. Negative for dizziness and headaches.   Physical Exam  Patient Vitals for the past 24 hrs:  BP Temp Temp src Pulse Resp SpO2  03/19/21 2010 139/71 -- -- 85 -- --  03/19/21 1707 (!) 143/66 -- -- 80 -- --  03/19/21 1350 125/87 98.4 F (36.9 C) Oral 99 18 99 %   Constitutional: Well-developed, well-nourished female in acute distress, heaving constantly as she was admitted via EMS Cardiovascular: normal rate & rhythm Respiratory: normal effort GI: Abd soft, non-tender, gravid appropriate for gestational age. Pos BS x 4 MS:  Extremities nontender, no edema, normal ROM Neurologic: Alert and oriented x 4 but very sleepy and stays in the fetal position with lights off GU: no CVA tenderness Pelvic: exam deferred  Labs: Results for orders placed or performed during the hospital encounter of 03/19/21 (from the past 24 hour(s))  CBC with Differential/Platelet     Status: Abnormal   Collection Time: 03/19/21  2:09 PM  Result Value Ref Range   WBC 12.2 (H) 4.0 - 10.5 K/uL   RBC 5.19 (H) 3.87 - 5.11 MIL/uL   Hemoglobin 15.4 (H) 12.0 - 15.0 g/dL   HCT 44.6 36.0 - 46.0 %   MCV 85.9 80.0 - 100.0 fL   MCH 29.7 26.0 - 34.0 pg   MCHC 34.5 30.0 - 36.0 g/dL   RDW 12.2 11.5 - 15.5 %   Platelets 425 (H) 150 - 400 K/uL   nRBC 0.0 0.0 - 0.2 %   Neutrophils Relative % 69 %   Neutro Abs 8.4 (H) 1.7 - 7.7 K/uL   Lymphocytes Relative 24 %   Lymphs Abs 3.0 0.7 - 4.0 K/uL   Monocytes Relative 7 %   Monocytes Absolute 0.8 0.1 - 1.0 K/uL   Eosinophils Relative 0 %   Eosinophils Absolute 0.0 0.0 - 0.5 K/uL   Basophils Relative 0 %   Basophils Absolute 0.0 0.0 - 0.1 K/uL   Immature Granulocytes 0 %   Abs Immature Granulocytes 0.03 0.00 - 0.07 K/uL  Comprehensive metabolic panel     Status: Abnormal   Collection Time: 03/19/21  2:09 PM  Result Value Ref Range   Sodium 135 135 - 145 mmol/L   Potassium 3.9 3.5 - 5.1 mmol/L   Chloride 97 (L) 98 - 111 mmol/L   CO2 24 22 - 32 mmol/L   Glucose, Bld 117 (H) 70 - 99 mg/dL   BUN 8 6 - 20 mg/dL   Creatinine, Ser 0.99 0.44 - 1.00 mg/dL   Calcium 9.6 8.9 - 10.3 mg/dL   Total Protein 7.9 6.5 - 8.1 g/dL   Albumin 4.1 3.5 - 5.0 g/dL   AST 29 15 - 41 U/L   ALT 40 0 - 44 U/L   Alkaline Phosphatase 47 38 - 126 U/L   Total Bilirubin 1.2 0.3 - 1.2 mg/dL   GFR, Estimated >60 >60 mL/min   Anion gap 14 5 - 15  Urinalysis, Routine w reflex microscopic Urine, Clean Catch     Status: Abnormal   Collection Time: 03/19/21  4:09 PM  Result Value Ref Range   Color, Urine AMBER (A) YELLOW    APPearance CLOUDY (A) CLEAR   Specific Gravity, Urine 1.027 1.005 - 1.030   pH 5.0 5.0 - 8.0   Glucose, UA NEGATIVE NEGATIVE mg/dL   Hgb urine dipstick MODERATE (A)  NEGATIVE   Bilirubin Urine NEGATIVE NEGATIVE   Ketones, ur 80 (A) NEGATIVE mg/dL   Protein, ur >=182 (A) NEGATIVE mg/dL   Nitrite NEGATIVE NEGATIVE   Leukocytes,Ua NEGATIVE NEGATIVE   RBC / HPF 11-20 0 - 5 RBC/hpf   WBC, UA 0-5 0 - 5 WBC/hpf   Bacteria, UA RARE (A) NONE SEEN   Squamous Epithelial / LPF 11-20 0 - 5   Mucus PRESENT    Hyaline Casts, UA PRESENT   Rapid urine drug screen (hospital performed)     Status: Abnormal   Collection Time: 03/19/21  4:09 PM  Result Value Ref Range   Opiates NONE DETECTED NONE DETECTED   Cocaine NONE DETECTED NONE DETECTED   Benzodiazepines NONE DETECTED NONE DETECTED   Amphetamines NONE DETECTED NONE DETECTED   Tetrahydrocannabinol POSITIVE (A) NONE DETECTED   Barbiturates NONE DETECTED NONE DETECTED   Imaging:  No results found.  MAU Course: Orders Placed This Encounter  Procedures   Urinalysis, Routine w reflex microscopic Urine, Clean Catch   CBC with Differential/Platelet   Comprehensive metabolic panel   Rapid urine drug screen (hospital performed)   Discharge patient   Meds ordered this encounter  Medications   lactated ringers bolus 1,000 mL   ondansetron (ZOFRAN) injection 4 mg   famotidine (PEPCID) IVPB 20 mg premix   lactated ringers bolus 1,000 mL   metoCLOPramide (REGLAN) injection 10 mg   DISCONTD: metoCLOPramide (REGLAN) 10 MG tablet    Sig: Take 1 tablet (10 mg total) by mouth every 6 (six) hours as needed for nausea.    Dispense:  120 tablet    Refill:  5    Order Specific Question:   Supervising Provider    Answer:   Reva Bores [[id number]]   metoCLOPramide (REGLAN) 10 MG tablet    Sig: Take 1 tablet (10 mg total) by mouth every 6 (six) hours as needed for nausea.    Dispense:  120 tablet    Refill:  5    Order Specific Question:   Supervising  Provider    Answer:   Jaynie Collins A [[id number]]   MDM: LR bolus, zofran and pepcid given with relief of nausea/vomiting but vomiting returned as soon as she tried sips of water. Pt able to pee after first bag of fluids, U/A showed moderate dehydration so another bag given as well as a dose of reglan. That completely resolved nausea and pt able to tolerate several cups of water and ice chips. Countenance changed as well, she could sit up with lights on and converse well with her sister. Expressed feeling much better. Discussed management of future HG with her, encouraged her not to wait two weeks without food/fluids before presenting again for rehydration/IV meds. Pt agreed. Also strongly encouraged her to begin care at [facility] as soon as possible. Advised if this continues we can get her into the infusion clinic for regular rehydration/IV med therapy. Pt expressed understanding/relief and requested to be on my schedule at [facility].  Assessment: 1. Hyperemesis affecting pregnancy, antepartum   2. Dehydration, moderate   3. [redacted] weeks gestation of pregnancy    Plan: Discharge home in stable condition with strong return precautions  Sent message to [facility] for scheduling assistance   Follow-up Information     [facility] for [facility] at [facility]. Schedule an appointment as soon as possible for a visit.   Specialty: Obstetrics and Gynecology Why: to begin prenatal care Contact information: [address]  [place] [phone]                Allergies as of 03/19/2021       Reactions   Penicillins Hives, Itching   Has patient had a PCN reaction causing immediate rash, facial/tongue/throat swelling, SOB or lightheadedness with hypotension: Yes Has patient had a PCN reaction causing severe rash involving mucus membranes or skin necrosis: No Has patient had a PCN reaction that required hospitalization Yes Has patient had a PCN reaction  occurring within the last 10 years: No If all of the above answers are "NO", then may proceed with Cephalosporin use.        Medication List     STOP taking these medications    ondansetron 8 MG disintegrating tablet Commonly known as: ZOFRAN-ODT   promethazine 25 MG tablet Commonly known as: PHENERGAN       TAKE these medications    famotidine 20 MG tablet Commonly known as: PEPCID Take 1 tablet (20 mg total) by mouth 2 (two) times daily.   metoCLOPramide 10 MG tablet Commonly known as: Reglan Take 1 tablet (10 mg total) by mouth every 6 (six) hours as needed for nausea.       Gaylan Gerold, CNM, MSN, [place] Certified Nurse Midwife, [facility]

## 2021-03-19 NOTE — MAU Note (Signed)
Pt reports nausea and vomiting x 2 weeks, unable to keep anything down for 2 weeks. Generalized abd pain x 3 days. Denies vaginal bleeding. Reports vomiting blood x two days.

## 2021-03-19 NOTE — MAU Note (Signed)
Registration called and said patient left the lobby with her friend. Security called and said patient stated she was going home.

## 2021-03-20 ENCOUNTER — Inpatient Hospital Stay (HOSPITAL_COMMUNITY)
Admission: AD | Admit: 2021-03-20 | Discharge: 2021-03-21 | Disposition: A | Discharge status: Home / Self Care | Payer: Medicaid Other | Attending: Obstetrics and Gynecology | Admitting: Obstetrics and Gynecology

## 2021-03-20 ENCOUNTER — Encounter (HOSPITAL_COMMUNITY): Payer: Self-pay | Admitting: Obstetrics and Gynecology

## 2021-03-20 ENCOUNTER — Other Ambulatory Visit: Payer: Self-pay

## 2021-03-20 DIAGNOSIS — O10911 Unspecified pre-existing hypertension complicating pregnancy, first trimester: Secondary | ICD-10-CM | POA: Diagnosis not present

## 2021-03-20 DIAGNOSIS — I10 Essential (primary) hypertension: Secondary | ICD-10-CM | POA: Diagnosis not present

## 2021-03-20 DIAGNOSIS — R109 Unspecified abdominal pain: Secondary | ICD-10-CM | POA: Diagnosis present

## 2021-03-20 DIAGNOSIS — R1084 Generalized abdominal pain: Secondary | ICD-10-CM | POA: Diagnosis not present

## 2021-03-20 DIAGNOSIS — Z3A08 8 weeks gestation of pregnancy: Secondary | ICD-10-CM | POA: Insufficient documentation

## 2021-03-20 DIAGNOSIS — F1290 Cannabis use, unspecified, uncomplicated: Secondary | ICD-10-CM | POA: Insufficient documentation

## 2021-03-20 DIAGNOSIS — R42 Dizziness and giddiness: Secondary | ICD-10-CM | POA: Diagnosis present

## 2021-03-20 DIAGNOSIS — O99321 Drug use complicating pregnancy, first trimester: Secondary | ICD-10-CM | POA: Diagnosis not present

## 2021-03-20 DIAGNOSIS — O210 Mild hyperemesis gravidarum: Secondary | ICD-10-CM | POA: Diagnosis not present

## 2021-03-20 DIAGNOSIS — O26891 Other specified pregnancy related conditions, first trimester: Secondary | ICD-10-CM | POA: Insufficient documentation

## 2021-03-20 DIAGNOSIS — O10919 Unspecified pre-existing hypertension complicating pregnancy, unspecified trimester: Secondary | ICD-10-CM

## 2021-03-20 DIAGNOSIS — R110 Nausea: Secondary | ICD-10-CM | POA: Diagnosis not present

## 2021-03-20 LAB — CBC
HCT: 43.3 % (ref 36.0–46.0)
Hemoglobin: 15 g/dL (ref 12.0–15.0)
MCH: 29.9 pg (ref 26.0–34.0)
MCHC: 34.6 g/dL (ref 30.0–36.0)
MCV: 86.3 fL (ref 80.0–100.0)
Platelets: 372 10*3/uL (ref 150–400)
RBC: 5.02 MIL/uL (ref 3.87–5.11)
RDW: 12.1 % (ref 11.5–15.5)
WBC: 10.2 10*3/uL (ref 4.0–10.5)
nRBC: 0 % (ref 0.0–0.2)

## 2021-03-20 LAB — BASIC METABOLIC PANEL
Anion gap: 12 (ref 5–15)
BUN: 5 mg/dL — ABNORMAL LOW (ref 6–20)
CO2: 25 mmol/L (ref 22–32)
Calcium: 9.5 mg/dL (ref 8.9–10.3)
Chloride: 98 mmol/L (ref 98–111)
Creatinine, Ser: 0.79 mg/dL (ref 0.44–1.00)
GFR, Estimated: >60 mL/min (ref >60)
Glucose, Bld: 101 mg/dL — ABNORMAL HIGH (ref 70–99)
Potassium: 3.6 mmol/L (ref 3.5–5.1)
Sodium: 135 mmol/L (ref 135–145)

## 2021-03-20 LAB — LIPASE, BLOOD: Lipase: 28 U/L (ref 11–51)

## 2021-03-20 MED ORDER — FAMOTIDINE IN NACL 20-0.9 MG/50ML-% IV SOLN
20.0000 mg | Freq: Once | INTRAVENOUS | Status: AC
  Administered 2021-03-20: 20 mg via INTRAVENOUS
  Filled 2021-03-20: qty 50

## 2021-03-20 MED ORDER — SCOPOLAMINE 1 MG/3DAYS TD PT72
1.0000 | MEDICATED_PATCH | TRANSDERMAL | Status: DC
  Administered 2021-03-20: 1.5 mg via TRANSDERMAL
  Filled 2021-03-20: qty 1

## 2021-03-20 MED ORDER — METOCLOPRAMIDE HCL 5 MG/ML IJ SOLN
10.0000 mg | Freq: Once | INTRAMUSCULAR | Status: AC
  Administered 2021-03-20: 10 mg via INTRAVENOUS
  Filled 2021-03-20: qty 2

## 2021-03-20 MED ORDER — LACTATED RINGERS IV BOLUS
1000.0000 mL | Freq: Once | INTRAVENOUS | Status: AC
  Administered 2021-03-20: 1000 mL via INTRAVENOUS

## 2021-03-20 NOTE — MAU Provider Note (Signed)
History     CSN: [id number]  Arrival date and time: 03/20/21 1918   Event Date/Time   First Provider Initiated Contact with Patient 03/20/21 2016     30 y.o. G2P0010 @[redacted]w[redacted]d  with IUP presenting via EMS for N/V, abd pain, and dizziness. This is her 3rd MAU visit for these sx. Reports onset 2 weeks ago. States she cannot tolerate any po. States when she was in MAU yesterday that IV Reglan helped. Abd pain is upper and down the middle. Describes as constant and burning. Rates pain 10/10. Also reports hematemesis. Reports last MJ use 2 weeks ago. Also reports no BM in 1 month. Also reports its hard for her to take a deep breath sometimes. Reports 20 lb weight loss this pregnancy.  OB History     Gravida  2   Para  0   Term      Preterm      AB  1   Living  0      SAB      IAB  1   Ectopic      Multiple      Live Births  0           Past Medical History:  Diagnosis Date   Chlamydia 06/29/2019   Hypertension    Obesity    PCOS (polycystic ovarian syndrome)     Past Surgical History:  Procedure Laterality Date   arm surgery Left    TONSILLECTOMY      Family History  Problem Relation Age of Onset   Cancer Mother    Hypertension Mother    Diabetes Father    Diabetes Brother    Hypertension Maternal Grandmother     Social History   Tobacco Use   Smoking status: Former    Packs/day: 0.25    Years: 15.00    Pack years: 3.75    Types: Cigarettes   Smokeless tobacco: Never  Vaping Use   Vaping Use: Never used  Substance Use Topics   Alcohol use: Not Currently    Comment: occasionally   Drug use: Not Currently    Allergies:  Allergies  Allergen Reactions   Penicillins Hives and Itching    Has patient had a PCN reaction causing immediate rash, facial/tongue/throat swelling, SOB or lightheadedness with hypotension: Yes Has patient had a PCN reaction causing severe rash involving mucus membranes or skin necrosis: No Has patient had a PCN reaction  that required hospitalization Yes Has patient had a PCN reaction occurring within the last 10 years: No If all of the above answers are "NO", then may proceed with Cephalosporin use.     No medications prior to admission.    Review of Systems  Constitutional:  Negative for chills and fever.  HENT:  Negative for congestion.   Respiratory:  Negative for cough and shortness of breath.   Cardiovascular:  Negative for chest pain.  Gastrointestinal:  Positive for constipation, nausea and vomiting.  Genitourinary:  Negative for vaginal bleeding.  Neurological:  Positive for dizziness. Negative for syncope.  Physical Exam   Blood pressure (!) 143/63, pulse 81, temperature 98.2 F (36.8 C), temperature source Oral, resp. rate 20, height 5\' 7"  (1.702 m), weight 117.8 kg, last menstrual period 01/20/2021, SpO2 100 %, unknown if currently breastfeeding.  Physical Exam Vitals and nursing note reviewed.  Constitutional:      General: She is not in acute distress.    Appearance: Normal appearance.  HENT:     Head:  Normocephalic and atraumatic.  Cardiovascular:     Rate and Rhythm: Normal rate.  Pulmonary:     Effort: Pulmonary effort is normal. No respiratory distress.  Abdominal:     General: There is no distension.     Palpations: Abdomen is soft. There is no mass.     Tenderness: There is no abdominal tenderness. There is no guarding or rebound.     Hernia: No hernia is present.  Musculoskeletal:        General: Normal range of motion.     Cervical back: Normal range of motion.  Skin:    General: Skin is warm and dry.  Neurological:     General: No focal deficit present.     Mental Status: She is alert and oriented to person, place, and time.  Psychiatric:        Mood and Affect: Mood normal.        Behavior: Behavior normal.   Results for orders placed or performed during the hospital encounter of 03/20/21 (from the past 24 hour(s))  CBC     Status: None   Collection Time:  03/20/21  8:32 PM  Result Value Ref Range   WBC 10.2 4.0 - 10.5 K/uL   RBC 5.02 3.87 - 5.11 MIL/uL   Hemoglobin 15.0 12.0 - 15.0 g/dL   HCT 60.6 00.4 - 59.9 %   MCV 86.3 80.0 - 100.0 fL   MCH 29.9 26.0 - 34.0 pg   MCHC 34.6 30.0 - 36.0 g/dL   RDW 77.4 14.2 - 39.5 %   Platelets 372 150 - 400 K/uL   nRBC 0.0 0.0 - 0.2 %  Basic metabolic panel     Status: Abnormal   Collection Time: 03/20/21  8:32 PM  Result Value Ref Range   Sodium 135 135 - 145 mmol/L   Potassium 3.6 3.5 - 5.1 mmol/L   Chloride 98 98 - 111 mmol/L   CO2 25 22 - 32 mmol/L   Glucose, Bld 101 (H) 70 - 99 mg/dL   BUN 5 (L) 6 - 20 mg/dL   Creatinine, Ser 3.20 0.44 - 1.00 mg/dL   Calcium 9.5 8.9 - 23.3 mg/dL   GFR, Estimated >43 >56 mL/min   Anion gap 12 5 - 15  Lipase, blood     Status: None   Collection Time: 03/20/21  8:51 PM  Result Value Ref Range   Lipase 28 11 - 51 U/L   MAU Course  Procedures LR Reglan Pepcid Scopolamine  MDM Labs ordered and reviewed. Chart reviewed. 16.5 lb documented weight loss. UDS positive for THC yesterday, suspect CHS, advised this could make N/V worse and cause abd pain, pt denies current use.  No further emesis after IVF and meds. Pt reports not feeling better but was able to tolerate sips and saltines. Watch BP, consider starting Labetalol. Stable for discharge home. Message sent to clinic to arrange twice weekly IV infusions.  Assessment and Plan   1. [redacted] weeks gestation of pregnancy   2. Morning sickness   3. Chronic hypertension affecting pregnancy    Discharge home Follow up at [facility] to start care Rx Scopolamine Continue Reglan- may place vaginally if needed Avoid THC exposure Return precautions  Allergies as of 03/21/2021       Reactions   Penicillins Hives, Itching   Has patient had a PCN reaction causing immediate rash, facial/tongue/throat swelling, SOB or lightheadedness with hypotension: Yes Has patient had a PCN reaction causing severe rash involving mucus  membranes or skin necrosis: No Has patient had a PCN reaction that required hospitalization Yes Has patient had a PCN reaction occurring within the last 10 years: No If all of the above answers are "NO", then may proceed with Cephalosporin use.        Medication List     TAKE these medications    famotidine 20 MG tablet Commonly known as: PEPCID Take 1 tablet (20 mg total) by mouth 2 (two) times daily.   metoCLOPramide 10 MG tablet Commonly known as: Reglan Take 1 tablet (10 mg total) by mouth every 6 (six) hours as needed for nausea.   scopolamine 1 MG/3DAYS Commonly known as: TRANSDERM-SCOP Place 1 patch (1.5 mg total) onto the skin every 3 (three) days. Start taking on: March 23, 2021       Donette Larry, [place] 03/21/2021, 1:41 AM

## 2021-03-20 NOTE — MAU Note (Signed)
Colleen Bradley is a 30 y.o. at [redacted]w[redacted]d here in MAU reporting: nausea and vomiting x 2 week. She states she is vomiting blood and urinating blood. She complains of feeling light headed and dizzy. And weak and with ambulation the only food she has had in 24 hours is peaches.. Pt reported that it was hard for her to breathe  Pt is not tachypnic, respirations are even and unlabored , mucous membranes are pink, O2 sats are 99-100%.  Pain score: 10 Vitals:   03/20/21 1931 03/20/21 1933  BP: (!) 146/89 (!) 149/95  Pulse: 87 92  Resp: 20   Temp: 98.2 F (36.8 C)   SpO2: 100%      Pt placed in family room for provider evaluation.

## 2021-03-21 DIAGNOSIS — O210 Mild hyperemesis gravidarum: Secondary | ICD-10-CM | POA: Diagnosis not present

## 2021-03-21 DIAGNOSIS — O10911 Unspecified pre-existing hypertension complicating pregnancy, first trimester: Secondary | ICD-10-CM | POA: Diagnosis not present

## 2021-03-21 DIAGNOSIS — Z3A08 8 weeks gestation of pregnancy: Secondary | ICD-10-CM

## 2021-03-21 MED ORDER — SCOPOLAMINE 1 MG/3DAYS TD PT72: 1.0000 | MEDICATED_PATCH | TRANSDERMAL | 12 refills | Status: AC

## 2021-03-29 ENCOUNTER — Telehealth (INDEPENDENT_AMBULATORY_CARE_PROVIDER_SITE_OTHER): Payer: Medicaid Other | Admitting: *Deleted

## 2021-03-29 DIAGNOSIS — O0990 Supervision of high risk pregnancy, unspecified, unspecified trimester: Secondary | ICD-10-CM | POA: Insufficient documentation

## 2021-03-29 NOTE — Progress Notes (Signed)
Isaac not connected virtually for her virtual appointment. I called and informed her I was calling about her virtual appointment. She informed me she terminated her pregnancy on 9th and so doesn't need appointment. I apologized for calling but explained I did not know. I asked if she was ok, and she confirmed she is.I explained we will cancel her appointments. She voices understanding. Talis Iwan,RN

## 2021-04-20 ENCOUNTER — Encounter: Payer: Medicaid Other | Admitting: Certified Nurse Midwife

## 2021-07-26 ENCOUNTER — Encounter: Payer: Self-pay | Admitting: Internal Medicine

## 2021-08-24 ENCOUNTER — Encounter (HOSPITAL_BASED_OUTPATIENT_CLINIC_OR_DEPARTMENT_OTHER): Payer: Self-pay

## 2021-08-24 ENCOUNTER — Emergency Department (HOSPITAL_BASED_OUTPATIENT_CLINIC_OR_DEPARTMENT_OTHER)
Admission: EM | Admit: 2021-08-24 | Discharge: 2021-08-24 | Disposition: A | Discharge status: Home / Self Care | Payer: Medicaid Other | Attending: Emergency Medicine | Admitting: Emergency Medicine

## 2021-08-24 ENCOUNTER — Other Ambulatory Visit: Payer: Self-pay

## 2021-08-24 ENCOUNTER — Emergency Department (HOSPITAL_BASED_OUTPATIENT_CLINIC_OR_DEPARTMENT_OTHER): Payer: Medicaid Other

## 2021-08-24 DIAGNOSIS — M7989 Other specified soft tissue disorders: Secondary | ICD-10-CM | POA: Diagnosis not present

## 2021-08-24 DIAGNOSIS — R58 Hemorrhage, not elsewhere classified: Secondary | ICD-10-CM | POA: Diagnosis not present

## 2021-08-24 DIAGNOSIS — G4489 Other headache syndrome: Secondary | ICD-10-CM | POA: Diagnosis not present

## 2021-08-24 DIAGNOSIS — S01112A Laceration without foreign body of left eyelid and periocular area, initial encounter: Secondary | ICD-10-CM | POA: Diagnosis not present

## 2021-08-24 DIAGNOSIS — Z87891 Personal history of nicotine dependence: Secondary | ICD-10-CM | POA: Diagnosis not present

## 2021-08-24 DIAGNOSIS — S63511A Sprain of carpal joint of right wrist, initial encounter: Secondary | ICD-10-CM | POA: Insufficient documentation

## 2021-08-24 DIAGNOSIS — I10 Essential (primary) hypertension: Secondary | ICD-10-CM | POA: Insufficient documentation

## 2021-08-24 DIAGNOSIS — M79643 Pain in unspecified hand: Secondary | ICD-10-CM | POA: Diagnosis not present

## 2021-08-24 DIAGNOSIS — S01111A Laceration without foreign body of right eyelid and periocular area, initial encounter: Secondary | ICD-10-CM | POA: Diagnosis not present

## 2021-08-24 DIAGNOSIS — S63501A Unspecified sprain of right wrist, initial encounter: Secondary | ICD-10-CM | POA: Diagnosis not present

## 2021-08-24 DIAGNOSIS — R42 Dizziness and giddiness: Secondary | ICD-10-CM | POA: Diagnosis not present

## 2021-08-24 DIAGNOSIS — S0993XA Unspecified injury of face, initial encounter: Secondary | ICD-10-CM | POA: Diagnosis present

## 2021-08-24 DIAGNOSIS — S63502A Unspecified sprain of left wrist, initial encounter: Secondary | ICD-10-CM | POA: Diagnosis not present

## 2021-08-24 MED ORDER — NAPROXEN 375 MG PO TABS: ORAL_TABLET | ORAL | 0 refills | Status: AC

## 2021-08-24 MED ORDER — TETANUS-DIPHTH-ACELL PERTUSSIS 5-2.5-18.5 LF-MCG/0.5 IM SUSY: 0.5000 mL | PREFILLED_SYRINGE | Freq: Once | INTRAMUSCULAR | Status: DC

## 2021-08-24 MED ORDER — LIDOCAINE-EPINEPHRINE (PF) 2 %-1:200000 IJ SOLN
10.0000 mL | Freq: Once | INTRAMUSCULAR | Status: DC
  Filled 2021-08-24: qty 20

## 2021-08-24 MED ORDER — NAPROXEN 250 MG PO TABS: 500.0000 mg | ORAL_TABLET | Freq: Once | ORAL | Status: DC
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
## 2021-08-25 ENCOUNTER — Telehealth: Payer: Self-pay

## 2021-08-25 NOTE — Telephone Encounter (Signed)
Transition Care Management Unsuccessful Follow-up Telephone Call ? ?Date of discharge and from where:  08/24/2021 from [facility] ? ?Attempts:  1st Attempt ? ?Reason for unsuccessful TCM follow-up call:  Left voice message ? ? ? ?

## 2021-08-26 NOTE — Telephone Encounter (Signed)
Transition Care Management Unsuccessful Follow-up Telephone Call ? ?Date of discharge and from where:  08/24/2021 from [facility] ? ?Attempts:  2nd Attempt ? ?Reason for unsuccessful TCM follow-up call:  Left voice message ? ? ? ?

## 2021-08-30 NOTE — Telephone Encounter (Signed)
Transition Care Management Unsuccessful Follow-up Telephone Call ? ?Date of discharge and from where:  08/24/2021 from [facility] ? ?Attempts:  3rd Attempt ? ?Reason for unsuccessful TCM follow-up call:  Unable to reach patient ? ? ? ?

## 2022-05-04 ENCOUNTER — Encounter: Payer: Medicaid Other | Admitting: Internal Medicine

## 2022-06-09 ENCOUNTER — Telehealth: Payer: Medicaid Other | Admitting: Physician Assistant

## 2022-06-09 DIAGNOSIS — J069 Acute upper respiratory infection, unspecified: Secondary | ICD-10-CM

## 2022-06-09 MED ORDER — FLUTICASONE PROPIONATE 50 MCG/ACT NA SUSP: 2.0000 | Freq: Every day | NASAL | 6 refills | Status: AC

## 2022-06-09 MED ORDER — PROMETHAZINE-DM 6.25-15 MG/5ML PO SYRP: 5.0000 mL | ORAL_SOLUTION | Freq: Four times a day (QID) | ORAL | 0 refills | Status: AC | PRN

## 2022-06-09 MED ORDER — ALBUTEROL SULFATE HFA 108 (90 BASE) MCG/ACT IN AERS: 2.0000 | INHALATION_SPRAY | Freq: Four times a day (QID) | RESPIRATORY_TRACT | 0 refills | Status: AC | PRN

## 2022-06-09 NOTE — Patient Instructions (Signed)
Colleen Bradley, thank you for joining Lenise Arena Ward, PA-C for today's virtual visit.  While this provider is not your primary care provider (PCP), if your PCP is located in our provider database this encounter information will be shared with them immediately following your visit.   [facility] account gives you access to today's visit and all your visits, tests, and labs performed at [facility] " click here if you don't have a [facility] account or go to [email or web address]  Consent: (Patient) Colleen Bradley provided verbal consent for this virtual visit at the beginning of the encounter.  Current Medications:  Current Outpatient Medications:    albuterol (VENTOLIN HFA) 108 (90 Base) MCG/ACT inhaler, Inhale 2 puffs into the lungs every 6 (six) hours as needed for wheezing or shortness of breath., Disp: 8 g, Rfl: 0   famotidine (PEPCID) 20 MG tablet, Take 1 tablet (20 mg total) by mouth 2 (two) times daily., Disp: 30 tablet, Rfl: 2   fluticasone (FLONASE) 50 MCG/ACT nasal spray, Place 2 sprays into both nostrils daily., Disp: 16 g, Rfl: 6   metoCLOPramide (REGLAN) 10 MG tablet, Take 1 tablet (10 mg total) by mouth every 6 (six) hours as needed for nausea., Disp: 120 tablet, Rfl: 5   naproxen (NAPROSYN) 375 MG tablet, Take 1 tablet twice daily as needed for pain., Disp: 20 tablet, Rfl: 0   promethazine-dextromethorphan (PROMETHAZINE-DM) 6.25-15 MG/5ML syrup, Take 5 mLs by mouth 4 (four) times daily as needed for cough., Disp: 118 mL, Rfl: 0   scopolamine (TRANSDERM-SCOP) 1 MG/3DAYS, Place 1 patch (1.5 mg total) onto the skin every 3 (three) days., Disp: 10 patch, Rfl: 12   Medications ordered in this encounter:  Meds ordered this encounter  Medications   albuterol (VENTOLIN HFA) 108 (90 Base) MCG/ACT inhaler    Sig: Inhale 2 puffs into the lungs every 6 (six) hours as needed for wheezing or shortness of breath.    Dispense:  8 g    Refill:  0    Order  Specific Question:   Supervising Provider    Answer:   Chase Picket [id number]   fluticasone (FLONASE) 50 MCG/ACT nasal spray    Sig: Place 2 sprays into both nostrils daily.    Dispense:  16 g    Refill:  6    Order Specific Question:   Supervising Provider    Answer:   Chase Picket [id number]   promethazine-dextromethorphan (PROMETHAZINE-DM) 6.25-15 MG/5ML syrup    Sig: Take 5 mLs by mouth 4 (four) times daily as needed for cough.    Dispense:  118 mL    Refill:  0    Order Specific Question:   Supervising Provider    Answer:   Chase Picket [id number]     *If you need refills on other medications prior to your next appointment, please contact your pharmacy*  Follow-Up: Call back or seek an in-person evaluation if the symptoms worsen or if the condition fails to improve as anticipated.  [place] [phone]  Other Instructions Recommend Mucinex and Flonase.  Use cough syrup as needed. Use inhaler as needed for shortness of breath.  If symptoms become worse recommend in person evaluation with PCP or Urgent Care.    If you have been instructed to have an in-person evaluation today at a local Urgent Care facility, please use the link below. It will take you to a list of all of our available [facility], including address, phone number and hours of operation. Please do not delay care.  [facility]  If you or a family member do not have a primary care provider, use the link below to schedule a visit and establish care. When you choose a [facility] primary care physician or advanced practice provider, you gain a long-term partner in health. Find a Primary Care Provider  Learn more about [place] [place] [facility]'s in-office and virtual care options: [facility]

## 2022-06-09 NOTE — Progress Notes (Signed)
Virtual Visit Consent   Colleen Bradley, you are scheduled for a virtual visit with a [facility] provider today. Just as with appointments in the office, your consent must be obtained to participate. Your consent will be active for this visit and any virtual visit you may have with one of our providers in the next 365 days. If you have a MyChart account, a copy of this consent can be sent to you electronically.  As this is a virtual visit, video technology does not allow for your provider to perform a traditional examination. This may limit your provider's ability to fully assess your condition. If your provider identifies any concerns that need to be evaluated in person or the need to arrange testing (such as labs, EKG, etc.), we will make arrangements to do so. Although advances in technology are sophisticated, we cannot ensure that it will always work on either your end or our end. If the connection with a video visit is poor, the visit may have to be switched to a telephone visit. With either a video or telephone visit, we are not always able to ensure that we have a secure connection.  By engaging in this virtual visit, you consent to the provision of healthcare and authorize for your insurance to be billed (if applicable) for the services provided during this visit. Depending on your insurance coverage, you may receive a charge related to this service.  I need to obtain your verbal consent now. Are you willing to proceed with your visit today? Colleen Bradley has provided verbal consent on 06/09/2022 for a virtual visit (video or telephone). Lenise Arena Ward, PA-C  Date: 06/09/2022 7:22 PM  Virtual Visit via Video Note   I, Lenise Arena Ward, connected with  Colleen Bradley  ([id number], 05/22/1990) on 06/09/22 at  7:45 PM EST by a video-enabled telemedicine application and verified that I am speaking with the correct person using two identifiers.  Location: Patient: Virtual Visit Location Patient:  Home Provider: Virtual Visit Location Provider: Home Office   I discussed the limitations of evaluation and management by telemedicine and the availability of in person appointments. The patient expressed understanding and agreed to proceed.    History of Present Illness: Colleen Bradley is a 32 y.o. who identifies as a female who was assigned female at birth, and is being seen today for congestion and cough that started about two days ago.  She reports cough is worse when lying flat at night. She reports subjective fever. She reports with coughing she has felt some shortness of breath, denies wheezing. Reports nausea, denies vomiting. She has taken a home covid test which was negative.  Co worker sick with coivd.  She has taken theraflu, dayquil, and using cough drops with temporary relief.   HPI: HPI  Problems:  Patient Active Problem List   Diagnosis Date Noted   Supervision of high risk pregnancy, antepartum 03/29/2021   Prediabetes 06/22/2019   Hirsutism 06/20/2019   Tobacco dependence 04/25/2019   PCOS (polycystic ovarian syndrome) 06/14/2006   OBESITY, NOS 06/14/2006   Essential hypertension 06/14/2006   ACNE 06/14/2006    Allergies:  Allergies  Allergen Reactions   Penicillins Hives and Itching    Has patient had a PCN reaction causing immediate rash, facial/tongue/throat swelling, SOB or lightheadedness with hypotension: Yes Has patient had a PCN reaction causing severe rash involving mucus membranes or skin necrosis: No Has patient had a PCN reaction that required hospitalization Yes Has patient  had a PCN reaction occurring within the last 10 years: No If all of the above answers are "NO", then may proceed with Cephalosporin use.    Medications:  Current Outpatient Medications:    albuterol (VENTOLIN HFA) 108 (90 Base) MCG/ACT inhaler, Inhale 2 puffs into the lungs every 6 (six) hours as needed for wheezing or shortness of breath., Disp: 8 g, Rfl: 0   famotidine (PEPCID) 20  MG tablet, Take 1 tablet (20 mg total) by mouth 2 (two) times daily., Disp: 30 tablet, Rfl: 2   fluticasone (FLONASE) 50 MCG/ACT nasal spray, Place 2 sprays into both nostrils daily., Disp: 16 g, Rfl: 6   metoCLOPramide (REGLAN) 10 MG tablet, Take 1 tablet (10 mg total) by mouth every 6 (six) hours as needed for nausea., Disp: 120 tablet, Rfl: 5   naproxen (NAPROSYN) 375 MG tablet, Take 1 tablet twice daily as needed for pain., Disp: 20 tablet, Rfl: 0   promethazine-dextromethorphan (PROMETHAZINE-DM) 6.25-15 MG/5ML syrup, Take 5 mLs by mouth 4 (four) times daily as needed for cough., Disp: 118 mL, Rfl: 0   scopolamine (TRANSDERM-SCOP) 1 MG/3DAYS, Place 1 patch (1.5 mg total) onto the skin every 3 (three) days., Disp: 10 patch, Rfl: 12  Observations/Objective: Patient is well-developed, well-nourished in no acute distress.  Resting comfortably at home.  Head is normocephalic, atraumatic.  No labored breathing.  Speech is clear and coherent with logical content.  Patient is alert and oriented at baseline.    Assessment and Plan: 1. Upper respiratory tract infection, unspecified type - albuterol (VENTOLIN HFA) 108 (90 Base) MCG/ACT inhaler; Inhale 2 puffs into the lungs every 6 (six) hours as needed for wheezing or shortness of breath.  Dispense: 8 g; Refill: 0 - fluticasone (FLONASE) 50 MCG/ACT nasal spray; Place 2 sprays into both nostrils daily.  Dispense: 16 g; Refill: 6 - promethazine-dextromethorphan (PROMETHAZINE-DM) 6.25-15 MG/5ML syrup; Take 5 mLs by mouth 4 (four) times daily as needed for cough.  Dispense: 118 mL; Refill: 0  Pt speaking in complete sentences, in no acute distress.  Advised in person evaluation if symptoms worsen.   Follow Up Instructions: I discussed the assessment and treatment plan with the patient. The patient was provided an opportunity to ask questions and all were answered. The patient agreed with the plan and demonstrated an understanding of the instructions.   A copy of instructions were sent to the patient via MyChart unless otherwise noted below.     The patient was advised to call back or seek an in-person evaluation if the symptoms worsen or if the condition fails to improve as anticipated.  Time:  I spent 7 minutes with the patient via telehealth technology discussing the above problems/concerns.    Lenise Arena Ward, PA-C

## 2022-08-19 ENCOUNTER — Telehealth: Payer: Medicaid Other | Admitting: Nurse Practitioner

## 2022-08-19 DIAGNOSIS — A084 Viral intestinal infection, unspecified: Secondary | ICD-10-CM

## 2022-08-19 MED ORDER — ONDANSETRON HCL 4 MG PO TABS: 4.0000 mg | ORAL_TABLET | Freq: Three times a day (TID) | ORAL | 0 refills | Status: AC | PRN

## 2022-08-19 NOTE — Progress Notes (Signed)
Virtual Visit Consent   Colleen Bradley, you are scheduled for a virtual visit with Colleen Daphine Deutscher, FNP, a [facility] provider, today.     Just as with appointments in the office, your consent must be obtained to participate.  Your consent will be active for this visit and any virtual visit you may have with one of our providers in the next 365 days.     If you have a MyChart account, a copy of this consent can be sent to you electronically.  All virtual visits are billed to your insurance company just like a traditional visit in the office.    As this is a virtual visit, video technology does not allow for your provider to perform a traditional examination.  This may limit your provider's ability to fully assess your condition.  If your provider identifies any concerns that need to be evaluated in person or the need to arrange testing (such as labs, EKG, etc.), we will make arrangements to do so.     Although advances in technology are sophisticated, we cannot ensure that it will always work on either your end or our end.  If the connection with a video visit is poor, the visit may have to be switched to a telephone visit.  With either a video or telephone visit, we are not always able to ensure that we have a secure connection.     I need to obtain your verbal consent now.   Are you willing to proceed with your visit today? YES   BARBAR LABELL has provided verbal consent on 08/19/2022 for a virtual visit (video or telephone).   Colleen Daphine Deutscher, FNP   Date: 08/19/2022 6:54 PM   Virtual Visit via Video Note   I, Colleen Bradley, connected with Colleen Bradley ([id number], 1990/09/25) on 08/19/22 at  7:00 PM EDT by a video-enabled telemedicine application and verified that I am speaking with the correct person using two identifiers.  Location: Patient: Virtual Visit Location Patient: Home Provider: Virtual Visit Location Provider: Mobile   I discussed the limitations of  evaluation and management by telemedicine and the availability of in person appointments. The patient expressed understanding and agreed to proceed.    History of Present Illness: Colleen Bradley is a 32 y.o. who identifies as a female who was assigned female at birth, and is being seen today for nausea and vomiting.  HPI: Emesis  This is a new problem. Episode onset: 48 hours. The problem occurs 5 to 10 times per day. Associated symptoms include diarrhea. Pertinent negatives include no abdominal pain, chest pain or headaches. Risk factors: pepto bismol.    Review of Systems  Cardiovascular:  Negative for chest pain.  Gastrointestinal:  Positive for diarrhea and vomiting. Negative for abdominal pain.  Neurological:  Negative for headaches.    Problems:  Patient Active Problem List   Diagnosis Date Noted   Supervision of high risk pregnancy, antepartum 03/29/2021   Prediabetes 06/22/2019   Hirsutism 06/20/2019   Tobacco dependence 04/25/2019   PCOS (polycystic ovarian syndrome) 06/14/2006   OBESITY, NOS 06/14/2006   Essential hypertension 06/14/2006   ACNE 06/14/2006    Allergies:  Allergies  Allergen Reactions   Penicillins Hives and Itching    Has patient had a PCN reaction causing immediate rash, facial/tongue/throat swelling, SOB or lightheadedness with hypotension: Yes Has patient had a PCN reaction causing severe rash involving mucus membranes or skin necrosis: No Has patient had a PCN reaction that  required hospitalization Yes Has patient had a PCN reaction occurring within the last 10 years: No If all of the above answers are "NO", then may proceed with Cephalosporin use.    Medications:  Current Outpatient Medications:    albuterol (VENTOLIN HFA) 108 (90 Base) MCG/ACT inhaler, Inhale 2 puffs into the lungs every 6 (six) hours as needed for wheezing or shortness of breath., Disp: 8 g, Rfl: 0   famotidine (PEPCID) 20 MG tablet, Take 1 tablet (20 mg total) by mouth 2 (two)  times daily., Disp: 30 tablet, Rfl: 2   fluticasone (FLONASE) 50 MCG/ACT nasal spray, Place 2 sprays into both nostrils daily., Disp: 16 g, Rfl: 6   metoCLOPramide (REGLAN) 10 MG tablet, Take 1 tablet (10 mg total) by mouth every 6 (six) hours as needed for nausea., Disp: 120 tablet, Rfl: 5   naproxen (NAPROSYN) 375 MG tablet, Take 1 tablet twice daily as needed for pain., Disp: 20 tablet, Rfl: 0   promethazine-dextromethorphan (PROMETHAZINE-DM) 6.25-15 MG/5ML syrup, Take 5 mLs by mouth 4 (four) times daily as needed for cough., Disp: 118 mL, Rfl: 0   scopolamine (TRANSDERM-SCOP) 1 MG/3DAYS, Place 1 patch (1.5 mg total) onto the skin every 3 (three) days., Disp: 10 patch, Rfl: 12  Observations/Objective: Patient is well-developed, well-nourished in no acute distress.  Resting comfortably  at home.  Head is normocephalic, atraumatic.  No labored breathing.  Speech is clear and coherent with logical content.  Patient is alert and oriented at baseline.    Assessment and Plan:  Colleen Bradley in today with chief complaint of nauseaa and vomiting   1. Viral gastroenteritis First 24 Hours-Clear liquids  popsicles  Jello  gatorade  Sprite Second 24 hours-Add Full liquids ( Liquids you cant see through) Third 24 hours- Bland diet ( foods that are baked or broiled)  *avoiding fried foods and highly spiced foods* During these 3 days  Avoid milk, cheese, ice cream or any other dairy products  Avoid caffeine- REMEMBER Mt. Dew and Mello Yellow contain lots of caffeine You should eat and drink in  Frequent small volumes If no improvement in symptoms or worsen in 2-3 days should RETRUN TO OFFICE or go to ER!    - ondansetron (ZOFRAN) 4 MG tablet; Take 1 tablet (4 mg total) by mouth every 8 (eight) hours as needed for nausea or vomiting.  Dispense: 20 tablet; Refill: 0  Follow Up Instructions: I discussed the assessment and treatment plan with the patient. The patient was provided an  opportunity to ask questions and all were answered. The patient agreed with the plan and demonstrated an understanding of the instructions.  A copy of instructions were sent to the patient via MyChart.  The patient was advised to call back or seek an in-person evaluation if the symptoms worsen or if the condition fails to improve as anticipated.  Time:  I spent 6 minutes with the patient via telehealth technology discussing the above problems/concerns.    Colleen Daphine Deutscher, FNP

## 2022-08-19 NOTE — Patient Instructions (Signed)
Colleen Bradley, thank you for joining Bennie Pierini, FNP for today's virtual visit.  While this provider is not your primary care provider (PCP), if your PCP is located in our provider database this encounter information will be shared with them immediately following your visit.   A [facility] MyChart account gives you access to today's visit and all your visits, tests, and labs performed at [facility] " click here if you don't have a [facility] MyChart account or go to [email or web address]  Consent: (Patient) Colleen Bradley provided verbal consent for this virtual visit at the beginning of the encounter.  Current Medications:  Current Outpatient Medications:    ondansetron (ZOFRAN) 4 MG tablet, Take 1 tablet (4 mg total) by mouth every 8 (eight) hours as needed for nausea or vomiting., Disp: 20 tablet, Rfl: 0   albuterol (VENTOLIN HFA) 108 (90 Base) MCG/ACT inhaler, Inhale 2 puffs into the lungs every 6 (six) hours as needed for wheezing or shortness of breath., Disp: 8 g, Rfl: 0   famotidine (PEPCID) 20 MG tablet, Take 1 tablet (20 mg total) by mouth 2 (two) times daily., Disp: 30 tablet, Rfl: 2   fluticasone (FLONASE) 50 MCG/ACT nasal spray, Place 2 sprays into both nostrils daily., Disp: 16 g, Rfl: 6   metoCLOPramide (REGLAN) 10 MG tablet, Take 1 tablet (10 mg total) by mouth every 6 (six) hours as needed for nausea., Disp: 120 tablet, Rfl: 5   naproxen (NAPROSYN) 375 MG tablet, Take 1 tablet twice daily as needed for pain., Disp: 20 tablet, Rfl: 0   promethazine-dextromethorphan (PROMETHAZINE-DM) 6.25-15 MG/5ML syrup, Take 5 mLs by mouth 4 (four) times daily as needed for cough., Disp: 118 mL, Rfl: 0   scopolamine (TRANSDERM-SCOP) 1 MG/3DAYS, Place 1 patch (1.5 mg total) onto the skin every 3 (three) days., Disp: 10 patch, Rfl: 12   Medications ordered in this encounter:  Meds ordered this encounter  Medications   ondansetron (ZOFRAN) 4 MG tablet    Sig: Take 1  tablet (4 mg total) by mouth every 8 (eight) hours as needed for nausea or vomiting.    Dispense:  20 tablet    Refill:  0    Order Specific Question:   Supervising Provider    Answer:   Merrilee Jansky [id number]     *If you need refills on other medications prior to your next appointment, please contact your pharmacy*  Follow-Up: Call back or seek an in-person evaluation if the symptoms worsen or if the condition fails to improve as anticipated.  [facility] [phone]  Other Instructions First 24 Hours-Clear liquids  popsicles  Jello  gatorade  Sprite Second 24 hours-Add Full liquids ( Liquids you cant see through) Third 24 hours- Bland diet ( foods that are baked or broiled)  *avoiding fried foods and highly spiced foods* During these 3 days  Avoid milk, cheese, ice cream or any other dairy products  Avoid caffeine- REMEMBER Mt. Dew and Mello Yellow contain lots of caffeine You should eat and drink in  Frequent small volumes If no improvement in symptoms or worsen in 2-3 days should RETRUN TO OFFICE or go to ER!      If you have been instructed to have an in-person evaluation today at a local Urgent Care facility, please use the link below. It will take you to a list of all of our available [facility], including address, phone number and hours of operation. Please do not  delay care.  [facility]  If you or a family member do not have a primary care provider, use the link below to schedule a visit and establish care. When you choose a [facility] primary care physician or advanced practice provider, you gain a long-term partner in health. Find a Primary Care Provider  Learn more about [facility]'s in-office and virtual care options: [facility]

## 2022-09-23 ENCOUNTER — Telehealth: Payer: Medicaid Other | Admitting: Nurse Practitioner

## 2022-09-23 NOTE — Progress Notes (Signed)
   No show for appointment- phone number list on chart is not in service. Email address did not work,. No answer with emergency contact number  Colleen Daphine Deutscher, FNP

## 2022-12-03 DIAGNOSIS — Z3201 Encounter for pregnancy test, result positive: Secondary | ICD-10-CM | POA: Diagnosis not present

## 2022-12-03 DIAGNOSIS — R1110 Vomiting, unspecified: Secondary | ICD-10-CM | POA: Diagnosis not present

## 2022-12-09 DIAGNOSIS — O211 Hyperemesis gravidarum with metabolic disturbance: Secondary | ICD-10-CM | POA: Diagnosis not present

## 2022-12-21 ENCOUNTER — Ambulatory Visit: Payer: Medicaid Other

## 2022-12-30 ENCOUNTER — Other Ambulatory Visit: Payer: Self-pay

## 2022-12-30 ENCOUNTER — Encounter (HOSPITAL_COMMUNITY): Payer: Self-pay

## 2022-12-30 ENCOUNTER — Emergency Department (HOSPITAL_COMMUNITY): Payer: Medicaid Other

## 2022-12-30 ENCOUNTER — Observation Stay (HOSPITAL_COMMUNITY)
Admission: EM | Admit: 2022-12-30 | Discharge: 2023-01-01 | Disposition: A | Discharge status: Home / Self Care | Payer: Medicaid Other | Attending: Internal Medicine | Admitting: Internal Medicine

## 2022-12-30 DIAGNOSIS — Z79899 Other long term (current) drug therapy: Secondary | ICD-10-CM

## 2022-12-30 DIAGNOSIS — E282 Polycystic ovarian syndrome: Secondary | ICD-10-CM | POA: Diagnosis present

## 2022-12-30 DIAGNOSIS — K5909 Other constipation: Principal | ICD-10-CM | POA: Diagnosis present

## 2022-12-30 DIAGNOSIS — R102 Pelvic and perineal pain: Secondary | ICD-10-CM | POA: Diagnosis not present

## 2022-12-30 DIAGNOSIS — N179 Acute kidney failure, unspecified: Secondary | ICD-10-CM | POA: Diagnosis present

## 2022-12-30 DIAGNOSIS — E876 Hypokalemia: Secondary | ICD-10-CM | POA: Diagnosis present

## 2022-12-30 DIAGNOSIS — E860 Dehydration: Secondary | ICD-10-CM | POA: Diagnosis present

## 2022-12-30 DIAGNOSIS — Z6837 Body mass index (BMI) 37.0-37.9, adult: Secondary | ICD-10-CM

## 2022-12-30 DIAGNOSIS — Z880 Allergy status to penicillin: Secondary | ICD-10-CM

## 2022-12-30 DIAGNOSIS — K5900 Constipation, unspecified: Secondary | ICD-10-CM | POA: Diagnosis not present

## 2022-12-30 DIAGNOSIS — Z8249 Family history of ischemic heart disease and other diseases of the circulatory system: Secondary | ICD-10-CM

## 2022-12-30 DIAGNOSIS — G8918 Other acute postprocedural pain: Secondary | ICD-10-CM | POA: Diagnosis present

## 2022-12-30 DIAGNOSIS — E871 Hypo-osmolality and hyponatremia: Secondary | ICD-10-CM | POA: Diagnosis present

## 2022-12-30 DIAGNOSIS — R1084 Generalized abdominal pain: Secondary | ICD-10-CM | POA: Diagnosis not present

## 2022-12-30 DIAGNOSIS — K760 Fatty (change of) liver, not elsewhere classified: Secondary | ICD-10-CM | POA: Diagnosis present

## 2022-12-30 DIAGNOSIS — K219 Gastro-esophageal reflux disease without esophagitis: Secondary | ICD-10-CM | POA: Diagnosis present

## 2022-12-30 DIAGNOSIS — I10 Essential (primary) hypertension: Secondary | ICD-10-CM | POA: Diagnosis present

## 2022-12-30 DIAGNOSIS — Z833 Family history of diabetes mellitus: Secondary | ICD-10-CM

## 2022-12-30 DIAGNOSIS — R109 Unspecified abdominal pain: Principal | ICD-10-CM | POA: Insufficient documentation

## 2022-12-30 DIAGNOSIS — R1030 Lower abdominal pain, unspecified: Secondary | ICD-10-CM | POA: Diagnosis not present

## 2022-12-30 DIAGNOSIS — R000 Tachycardia, unspecified: Secondary | ICD-10-CM | POA: Diagnosis not present

## 2022-12-30 DIAGNOSIS — E669 Obesity, unspecified: Secondary | ICD-10-CM | POA: Diagnosis present

## 2022-12-30 DIAGNOSIS — R7303 Prediabetes: Secondary | ICD-10-CM | POA: Diagnosis present

## 2022-12-30 DIAGNOSIS — O039 Complete or unspecified spontaneous abortion without complication: Secondary | ICD-10-CM | POA: Insufficient documentation

## 2022-12-30 DIAGNOSIS — G4489 Other headache syndrome: Secondary | ICD-10-CM | POA: Diagnosis not present

## 2022-12-30 DIAGNOSIS — F17210 Nicotine dependence, cigarettes, uncomplicated: Secondary | ICD-10-CM | POA: Diagnosis present

## 2022-12-30 HISTORY — DX: Personal history of other complications of pregnancy, childbirth and the puerperium: Z87.59

## 2022-12-30 HISTORY — DX: Other constipation: K59.09

## 2022-12-30 LAB — HCG, QUANTITATIVE, PREGNANCY: hCG, Beta Chain, Quant, S: 1830 m[IU]/mL — ABNORMAL HIGH (ref <5)

## 2022-12-30 LAB — COMPREHENSIVE METABOLIC PANEL
ALT: 219 U/L — ABNORMAL HIGH (ref 0–44)
AST: 57 U/L — ABNORMAL HIGH (ref 15–41)
Albumin: 4 g/dL (ref 3.5–5.0)
Alkaline Phosphatase: 61 U/L (ref 38–126)
Anion gap: 18 — ABNORMAL HIGH (ref 5–15)
BUN: 32 mg/dL — ABNORMAL HIGH (ref 6–20)
CO2: 28 mmol/L (ref 22–32)
Calcium: 9 mg/dL (ref 8.9–10.3)
Chloride: 84 mmol/L — ABNORMAL LOW (ref 98–111)
Creatinine, Ser: 1.98 mg/dL — ABNORMAL HIGH (ref 0.44–1.00)
GFR, Estimated: 34 mL/min — ABNORMAL LOW (ref >60)
Glucose, Bld: 140 mg/dL — ABNORMAL HIGH (ref 70–99)
Potassium: 2.3 mmol/L — CL (ref 3.5–5.1)
Sodium: 130 mmol/L — ABNORMAL LOW (ref 135–145)
Total Bilirubin: 0.8 mg/dL (ref 0.3–1.2)
Total Protein: 7.8 g/dL (ref 6.5–8.1)

## 2022-12-30 LAB — CBC
HCT: 43.4 % (ref 36.0–46.0)
Hemoglobin: 15.1 g/dL — ABNORMAL HIGH (ref 12.0–15.0)
MCH: 29.2 pg (ref 26.0–34.0)
MCHC: 34.8 g/dL (ref 30.0–36.0)
MCV: 83.8 fL (ref 80.0–100.0)
Platelets: 347 10*3/uL (ref 150–400)
RBC: 5.18 MIL/uL — ABNORMAL HIGH (ref 3.87–5.11)
RDW: 11.7 % (ref 11.5–15.5)
WBC: 8 10*3/uL (ref 4.0–10.5)
nRBC: 0 % (ref 0.0–0.2)

## 2022-12-30 LAB — LIPASE, BLOOD: Lipase: 39 U/L (ref 11–51)

## 2022-12-30 MED ORDER — POTASSIUM CHLORIDE CRYS ER 20 MEQ PO TBCR
40.0000 meq | EXTENDED_RELEASE_TABLET | Freq: Once | ORAL | Status: AC
  Administered 2022-12-30: 40 meq via ORAL
  Filled 2022-12-30: qty 2

## 2022-12-30 MED ORDER — DIPHENHYDRAMINE HCL 50 MG/ML IJ SOLN
25.0000 mg | Freq: Once | INTRAMUSCULAR | Status: AC
  Administered 2022-12-30: 25 mg via INTRAVENOUS
  Filled 2022-12-30: qty 1

## 2022-12-30 MED ORDER — PROCHLORPERAZINE EDISYLATE 10 MG/2ML IJ SOLN
10.0000 mg | Freq: Once | INTRAMUSCULAR | Status: AC
Start: 2022-12-30 — End: 2022-12-30
  Administered 2022-12-30: 10 mg via INTRAVENOUS
  Filled 2022-12-30: qty 2

## 2022-12-30 MED ORDER — LACTATED RINGERS IV BOLUS
1000.0000 mL | Freq: Once | INTRAVENOUS | Status: AC
  Administered 2022-12-30: 1000 mL via INTRAVENOUS

## 2022-12-30 MED ORDER — POTASSIUM CHLORIDE 10 MEQ/100ML IV SOLN
10.0000 meq | INTRAVENOUS | Status: AC
  Administered 2022-12-30 – 2022-12-31 (×3): 10 meq via INTRAVENOUS
  Filled 2022-12-30 (×3): qty 100

## 2022-12-30 MED ORDER — IOHEXOL 300 MG/ML  SOLN
100.0000 mL | Freq: Once | INTRAMUSCULAR | Status: AC | PRN
  Administered 2022-12-30: 100 mL via INTRAVENOUS

## 2022-12-30 NOTE — ED Triage Notes (Signed)
Pt arrived home, BIB [facility] for abd pain for several months, has hx of constipation, reports no "bowel movement in 2 months", used fleet enema 2 days ago and today w/o success. Headache that started today, medical abortion at [facility]" on Tuesday, denies any vaginal bleeding. A&O x4, VSS, 500 ml NS bolus PTA.

## 2022-12-30 NOTE — ED Provider Notes (Signed)
EMERGENCY DEPARTMENT AT [facility] Provider Note  CSN: [id number] Arrival date & time: 12/30/22 2123  Chief Complaint(s) Abdominal Pain  HPI Colleen Bradley is a 32 y.o. female who presents emergency room for evaluation of abdominal pain and constipation.  Patient states that she has not had a bowel movement in "2 months".  States that she recently had a vacuum-assisted abortion 5 days ago and had many issues with the pregnancy including nausea vomiting and constipation.  Currently endorses lower crampy abdominal pain but denies chest pain, shortness of breath, headache, fever or other systemic symptoms.   Past Medical History Past Medical History:  Diagnosis Date   Abortion history    Chlamydia 06/29/2019   Constipation, chronic    Hypertension    Obesity    PCOS (polycystic ovarian syndrome)    Patient Active Problem List   Diagnosis Date Noted   Supervision of high risk pregnancy, antepartum 03/29/2021   Prediabetes 06/22/2019   Hirsutism 06/20/2019   Tobacco dependence 04/25/2019   PCOS (polycystic ovarian syndrome) 06/14/2006   OBESITY, NOS 06/14/2006   Essential hypertension 06/14/2006   ACNE 06/14/2006   Home Medication(s) Prior to Admission medications   Medication Sig Start Date End Date Taking? Authorizing Provider  albuterol (VENTOLIN HFA) 108 (90 Base) MCG/ACT inhaler Inhale 2 puffs into the lungs every 6 (six) hours as needed for wheezing or shortness of breath. 06/09/22   Ward, Tylene Fantasia, PA-C  famotidine (PEPCID) 20 MG tablet Take 1 tablet (20 mg total) by mouth 2 (two) times daily. 03/11/21   Rolm Bookbinder, CNM  fluticasone (FLONASE) 50 MCG/ACT nasal spray Place 2 sprays into both nostrils daily. 06/09/22   Ward, Tylene Fantasia, PA-C  metoCLOPramide (REGLAN) 10 MG tablet Take 1 tablet (10 mg total) by mouth every 6 (six) hours as needed for nausea. 03/19/21   Leftwich-Kirby, Wilmer Floor, CNM  naproxen (NAPROSYN) 375 MG tablet Take 1 tablet twice  daily as needed for pain. 08/24/21   Molpus, John, MD  ondansetron (ZOFRAN) 4 MG tablet Take 1 tablet (4 mg total) by mouth every 8 (eight) hours as needed for nausea or vomiting. 08/19/22   Daphine Deutscher Mary-Margaret, FNP  promethazine-dextromethorphan (PROMETHAZINE-DM) 6.25-15 MG/5ML syrup Take 5 mLs by mouth 4 (four) times daily as needed for cough. 06/09/22   Ward, Tylene Fantasia, PA-C  scopolamine (TRANSDERM-SCOP) 1 MG/3DAYS Place 1 patch (1.5 mg total) onto the skin every 3 (three) days. 03/23/21   Donette Larry, CNM                                                                                                                                    Past Surgical History Past Surgical History:  Procedure Laterality Date   abortion     arm surgery Left    TONSILLECTOMY     Family History Family History  Problem Relation Age of Onset   Cancer Mother  Hypertension Mother    Diabetes Father    Diabetes Brother    Hypertension Maternal Grandmother     Social History Social History   Tobacco Use   Smoking status: Former    Current packs/day: 0.25    Average packs/day: 0.3 packs/day for 15.0 years (3.8 ttl pk-yrs)    Types: Cigarettes   Smokeless tobacco: Never  Vaping Use   Vaping status: Never Used  Substance Use Topics   Alcohol use: Yes    Comment: occasionally   Drug use: Not Currently   Allergies Penicillins  Review of Systems Review of Systems  Gastrointestinal:  Positive for abdominal pain and constipation.    Physical Exam Vital Signs  I have reviewed the triage vital signs BP 138/88 (BP Location: Right Arm)   Pulse 83   Temp 98.1 F (36.7 C)   Resp 16   Ht 5\' 7"  (1.702 m)   Wt 108.9 kg   LMP 10/13/2022 (Approximate) Comment: Abortion 12/26/2022  SpO2 100%   BMI 37.59 kg/m   Physical Exam Vitals and nursing note reviewed.  Constitutional:      General: She is not in acute distress.    Appearance: She is well-developed.  HENT:     Head: Normocephalic and  atraumatic.  Eyes:     Conjunctiva/sclera: Conjunctivae normal.  Cardiovascular:     Rate and Rhythm: Normal rate and regular rhythm.     Heart sounds: No murmur heard. Pulmonary:     Effort: Pulmonary effort is normal. No respiratory distress.     Breath sounds: Normal breath sounds.  Abdominal:     Palpations: Abdomen is soft.     Tenderness: There is abdominal tenderness in the periumbilical area.  Musculoskeletal:        General: No swelling.     Cervical back: Neck supple.  Skin:    General: Skin is warm and dry.     Capillary Refill: Capillary refill takes less than 2 seconds.  Neurological:     Mental Status: She is alert.  Psychiatric:        Mood and Affect: Mood normal.     ED Results and Treatments Labs (all labs ordered are listed, but only abnormal results are displayed) Labs Reviewed  LIPASE, BLOOD  COMPREHENSIVE METABOLIC PANEL  CBC  URINALYSIS, ROUTINE W REFLEX MICROSCOPIC  HCG, QUANTITATIVE, PREGNANCY                                                                                                                          Radiology No results found.  Pertinent labs & imaging results that were available during my care of the patient were reviewed by me and considered in my medical decision making (see MDM for details).  Medications Ordered in ED Medications  prochlorperazine (COMPAZINE) injection 10 mg (10 mg Intravenous Given 12/30/22 2201)  diphenhydrAMINE (BENADRYL) injection 25 mg (25 mg Intravenous Given 12/30/22 2200)  lactated ringers bolus 1,000 mL (1,000 mLs Intravenous New  Bag/Given 12/30/22 2159)                                                                                                                                     Procedures Procedures  (including critical care time)  Medical Decision Making / ED Course   This patient presents to the ED for concern of abdominal pain, this involves an extensive number of treatment options, and  is a complaint that carries with it a high risk of complications and morbidity.  The differential diagnosis includes diverticulitis, epiploic appendagitis, colitis, gastroenteritis, constipation, nephrolithiasis, inflammatory bowel disease,  MDM: Patient seen emergency room for evaluation of abdominal pain.  Physical exam with epigastric and left lower quadrant tenderness to palpation.  At time of signout, patient pending laboratory evaluation and imaging studies.  Please see provider signout for continuation of workup.   Additional history obtained:  -External records from outside source obtained and reviewed including: Chart review including previous notes, labs, imaging, consultation notes   Lab Tests: -I ordered, reviewed, and interpreted labs.   The pertinent results include:   Labs Reviewed  LIPASE, BLOOD  COMPREHENSIVE METABOLIC PANEL  CBC  URINALYSIS, ROUTINE W REFLEX MICROSCOPIC  HCG, QUANTITATIVE, PREGNANCY      EKG   EKG Interpretation Date/Time:  Sunday December 31 2022 06:33:53 EDT Ventricular Rate:  90 PR Interval:  147 QRS Duration:  101 QT Interval:  456 QTC Calculation: 558 R Axis:   73  Text Interpretation: Sinus rhythm Prolonged QT interval Confirmed by Reynalda Canny ([id number]) on 12/31/2022 1:13:27 PM          Medicines ordered and prescription drug management: Meds ordered this encounter  Medications   prochlorperazine (COMPAZINE) injection 10 mg   diphenhydrAMINE (BENADRYL) injection 25 mg   lactated ringers bolus 1,000 mL    -I have reviewed the patients home medicines and have made adjustments as needed  Critical interventions none   Cardiac Monitoring: The patient was maintained on a cardiac monitor.  I personally viewed and interpreted the cardiac monitored which showed an underlying rhythm of: NSR  Social Determinants of Health:  Factors impacting patients care include: none   Reevaluation: After the interventions noted above, I  reevaluated the patient and found that they have :stayed the same  Co morbidities that complicate the patient evaluation  Past Medical History:  Diagnosis Date   Abortion history    Chlamydia 06/29/2019   Constipation, chronic    Hypertension    Obesity    PCOS (polycystic ovarian syndrome)       Dispostion: I considered admission for this patient, and disposition pending completion of laboratory and imaging studies.  Please see provider signout for continuation of workup.     Final Clinical Impression(s) / ED Diagnoses Final diagnoses:  None     @PCDICTATION @    Glendora Score, MD 12/31/22 1313

## 2022-12-30 NOTE — ED Provider Notes (Signed)
Care assumed from Dr. Posey Rea.  Patient here with abdominal pain, constipation, "no bowel movements for 2 months".  Did have medical abortion 5 days ago.  No vaginal bleeding.  Vital signs stable.  Awaiting CT scan.  Remarkable for significant hypokalemia of 2.3, AKI with creatinine 1.98  CT scan is negative for acute process.  Does show hepatic steatosis.   Pelvic ultrasound shows no evidence of retained products of conception or any other ovarian pathology or uterine pathology.  Plan admission for IV hydration given her AKI and hypokalemia.  Discussed with Dr. Loney Loh.    Colleen Octave, MD 12/31/22 [phone]

## 2022-12-31 ENCOUNTER — Emergency Department (HOSPITAL_COMMUNITY): Payer: Medicaid Other

## 2022-12-31 DIAGNOSIS — R109 Unspecified abdominal pain: Secondary | ICD-10-CM

## 2022-12-31 DIAGNOSIS — E871 Hypo-osmolality and hyponatremia: Secondary | ICD-10-CM

## 2022-12-31 DIAGNOSIS — R102 Pelvic and perineal pain: Secondary | ICD-10-CM | POA: Diagnosis not present

## 2022-12-31 DIAGNOSIS — K5900 Constipation, unspecified: Secondary | ICD-10-CM

## 2022-12-31 DIAGNOSIS — N179 Acute kidney failure, unspecified: Principal | ICD-10-CM | POA: Diagnosis present

## 2022-12-31 DIAGNOSIS — E876 Hypokalemia: Secondary | ICD-10-CM

## 2022-12-31 LAB — COMPREHENSIVE METABOLIC PANEL
ALT: 187 U/L — ABNORMAL HIGH (ref 0–44)
ALT: 149 U/L — ABNORMAL HIGH (ref 0–44)
AST: 47 U/L — ABNORMAL HIGH (ref 15–41)
AST: 41 U/L (ref 15–41)
Albumin: 3.8 g/dL (ref 3.5–5.0)
Albumin: 3.4 g/dL — ABNORMAL LOW (ref 3.5–5.0)
Alkaline Phosphatase: 55 U/L (ref 38–126)
Alkaline Phosphatase: 46 U/L (ref 38–126)
Anion gap: 14 (ref 5–15)
Anion gap: 11 (ref 5–15)
BUN: 25 mg/dL — ABNORMAL HIGH (ref 6–20)
BUN: 23 mg/dL — ABNORMAL HIGH (ref 6–20)
CO2: 30 mmol/L (ref 22–32)
CO2: 28 mmol/L (ref 22–32)
Calcium: 9.3 mg/dL (ref 8.9–10.3)
Calcium: 8.7 mg/dL — ABNORMAL LOW (ref 8.9–10.3)
Chloride: 87 mmol/L — ABNORMAL LOW (ref 98–111)
Chloride: 95 mmol/L — ABNORMAL LOW (ref 98–111)
Creatinine, Ser: 1.45 mg/dL — ABNORMAL HIGH (ref 0.44–1.00)
Creatinine, Ser: 1.59 mg/dL — ABNORMAL HIGH (ref 0.44–1.00)
GFR, Estimated: 49 mL/min — ABNORMAL LOW (ref >60)
GFR, Estimated: 44 mL/min — ABNORMAL LOW (ref >60)
Glucose, Bld: 114 mg/dL — ABNORMAL HIGH (ref 70–99)
Glucose, Bld: 113 mg/dL — ABNORMAL HIGH (ref 70–99)
Potassium: 2.8 mmol/L — ABNORMAL LOW (ref 3.5–5.1)
Potassium: 3.3 mmol/L — ABNORMAL LOW (ref 3.5–5.1)
Sodium: 131 mmol/L — ABNORMAL LOW (ref 135–145)
Sodium: 134 mmol/L — ABNORMAL LOW (ref 135–145)
Total Bilirubin: 1.1 mg/dL (ref 0.3–1.2)
Total Bilirubin: 0.7 mg/dL (ref 0.3–1.2)
Total Protein: 7.4 g/dL (ref 6.5–8.1)
Total Protein: 6.5 g/dL (ref 6.5–8.1)

## 2022-12-31 LAB — URINALYSIS, ROUTINE W REFLEX MICROSCOPIC
Bilirubin Urine: NEGATIVE
Glucose, UA: NEGATIVE mg/dL
Ketones, ur: 5 mg/dL — AB
Leukocytes,Ua: NEGATIVE
Nitrite: NEGATIVE
Protein, ur: NEGATIVE mg/dL
Specific Gravity, Urine: 1.029 (ref 1.005–1.030)
pH: 5 (ref 5.0–8.0)

## 2022-12-31 LAB — HCG, QUANTITATIVE, PREGNANCY: hCG, Beta Chain, Quant, S: 1096 m[IU]/mL — ABNORMAL HIGH (ref <5)

## 2022-12-31 LAB — MAGNESIUM: Magnesium: 2.3 mg/dL (ref 1.7–2.4)

## 2022-12-31 LAB — HIV ANTIBODY (ROUTINE TESTING W REFLEX): HIV Screen 4th Generation wRfx: NONREACTIVE

## 2022-12-31 MED ORDER — SODIUM CHLORIDE 0.9 % IV SOLN: INTRAVENOUS | Status: DC

## 2022-12-31 MED ORDER — POLYETHYLENE GLYCOL 3350 17 G PO PACK
17.0000 g | PACK | Freq: Every day | ORAL | Status: DC
  Administered 2022-12-31 – 2023-01-01 (×2): 17 g via ORAL
  Filled 2022-12-31 (×2): qty 1

## 2022-12-31 MED ORDER — PROCHLORPERAZINE EDISYLATE 10 MG/2ML IJ SOLN: 5.0000 mg | Freq: Four times a day (QID) | INTRAMUSCULAR | Status: DC | PRN

## 2022-12-31 MED ORDER — SENNOSIDES-DOCUSATE SODIUM 8.6-50 MG PO TABS: 1.0000 | ORAL_TABLET | Freq: Every evening | ORAL | Status: DC | PRN

## 2022-12-31 MED ORDER — ENOXAPARIN SODIUM 60 MG/0.6ML IJ SOSY
50.0000 mg | PREFILLED_SYRINGE | Freq: Every day | INTRAMUSCULAR | Status: DC
  Administered 2022-12-31 – 2023-01-01 (×2): 50 mg via SUBCUTANEOUS
  Filled 2022-12-31 (×2): qty 0.6

## 2022-12-31 MED ORDER — KETOROLAC TROMETHAMINE 15 MG/ML IJ SOLN
15.0000 mg | INTRAMUSCULAR | Status: AC
  Administered 2022-12-31: 15 mg via INTRAVENOUS

## 2022-12-31 MED ORDER — SORBITOL 70 % SOLN
400.0000 mL | TOPICAL_OIL | Freq: Once | ORAL | Status: AC
  Administered 2022-12-31: 400 mL via RECTAL
  Filled 2022-12-31: qty 120

## 2022-12-31 MED ORDER — POTASSIUM CHLORIDE CRYS ER 20 MEQ PO TBCR
40.0000 meq | EXTENDED_RELEASE_TABLET | Freq: Once | ORAL | Status: AC
  Administered 2022-12-31: 40 meq via ORAL
  Filled 2022-12-31: qty 2

## 2022-12-31 MED ORDER — POTASSIUM CHLORIDE IN NACL 40-0.9 MEQ/L-% IV SOLN
INTRAVENOUS | Status: AC
  Filled 2022-12-31 (×3): qty 1000

## 2022-12-31 MED ORDER — ENOXAPARIN SODIUM 40 MG/0.4ML IJ SOSY: 40.0000 mg | PREFILLED_SYRINGE | INTRAMUSCULAR | Status: DC

## 2022-12-31 NOTE — ED Notes (Signed)
[place] tech at bedside for trans/vag pelvic US

## 2022-12-31 NOTE — Progress Notes (Addendum)
The patient was admitted this morning due to abdominal pain.  Associated with significant constipation.  Reports having an abortion 5 days ago.  Ultrasound of pelvis was negative for any acute findings.  hCG still elevated, will repeat level.  Lab studies also notable for electrolyte abnormalities that are being repleted.  Elevated creatinine, AKI which is improving with IV fluid.    CT abdomen showing no acute intra-abdominal findings, no gallbladder wall thickening, no biliary dilatation, no gallstones noted.  Hepatic steatosis.  Physical exam is benign.  Bowel sounds noted.  No significant tenderness to palpation.  Smog enema and diet ordered.  Will repeat CMP this afternoon to reassess electrolytes and LFTs abnormalities.    Anticipate discharge on 01/01/2023 once symptomatology and electrolytes have improved.  Time: 15 minutes.

## 2022-12-31 NOTE — H&P (Signed)
History and Physical    Colleen Bradley ZOX:[id number] DOB: Dec 14, 1990 DOA: 12/30/2022  PCP: Marcine Matar, MD  Patient coming from: Home  Chief Complaint: Abdominal pain  HPI: Colleen Bradley is a 32 y.o. female with medical history significant of hypertension, obesity, PCOS, prediabetes, chronic constipation, former tobacco abuse, GERD presented to ED complaining of abdominal pain and constipation.  She reported constipation for the past 2 months.  She also had a recent vacuum-assisted abortion 5 days ago but denied vaginal bleeding.  Vital signs stable, afebrile.  Labs showing no leukocytosis, hemoglobin 15.1, sodium 130, potassium 2.3, chloride 84, glucose 140, BUN 32, creatinine 1.9 (baseline 0.7-0.9), AST 57, ALT 219, alk phos and T. bili normal, lipase normal, magnesium level pending, UA pending, beta-hCG 1830.  CT abdomen pelvis with contrast showing hepatic steatosis and no acute intra-abdominal process.  Pelvic ultrasound negative for acute finding. Patient was given Benadryl, oral potassium 40 mEq, IV potassium 10 mEq x 3, Compazine, and 2 L LR in the ED.  [facility] called to admit.   Patient states she had an abortion 5 days ago. She was [redacted] weeks pregnant and reports a difficult time dealing with nausea and vomiting throughout her pregnancy. She also reports pain across her lower abdomen which started prior to her abortion, about 2 weeks ago. Patient states she is very constipated and has not had any bowel movements for the past 2 months despite enemas at home.  She had some mild vaginal bleeding right after the abortion but it has resolved.  She has been having muscle cramps as well.  She reports improvement of her symptoms after receiving potassium in the ED.  Denies any abdominal pain at this time.  Nausea and vomiting have resolved.  Review of Systems:  Review of Systems  All other systems reviewed and are negative.   Past Medical History:  Diagnosis Date   Abortion history     Chlamydia 06/29/2019   Constipation, chronic    Hypertension    Obesity    PCOS (polycystic ovarian syndrome)     Past Surgical History:  Procedure Laterality Date   abortion     arm surgery Left    TONSILLECTOMY       reports that she has quit smoking. Her smoking use included cigarettes. She has a 3.8 pack-year smoking history. She has never used smokeless tobacco. She reports current alcohol use. She reports that she does not currently use drugs.  Allergies  Allergen Reactions   Penicillins Hives and Itching    Has patient had a PCN reaction causing immediate rash, facial/tongue/throat swelling, SOB or lightheadedness with hypotension: Yes Has patient had a PCN reaction causing severe rash involving mucus membranes or skin necrosis: No Has patient had a PCN reaction that required hospitalization Yes Has patient had a PCN reaction occurring within the last 10 years: No If all of the above answers are "NO", then may proceed with Cephalosporin use.     Family History  Problem Relation Age of Onset   Cancer Mother    Hypertension Mother    Diabetes Father    Diabetes Brother    Hypertension Maternal Grandmother     Prior to Admission medications   Medication Sig Start Date End Date Taking? Authorizing Provider  albuterol (VENTOLIN HFA) 108 (90 Base) MCG/ACT inhaler Inhale 2 puffs into the lungs every 6 (six) hours as needed for wheezing or shortness of breath. Patient not taking: Reported on 12/31/2022 06/09/22   Ward,  Tylene Fantasia, PA-C  famotidine (PEPCID) 20 MG tablet Take 1 tablet (20 mg total) by mouth 2 (two) times daily. Patient not taking: Reported on 12/31/2022 03/11/21   Rolm Bookbinder, CNM  fluticasone [facility]) 50 MCG/ACT nasal spray Place 2 sprays into both nostrils daily. Patient not taking: Reported on 12/31/2022 06/09/22   Ward, Tylene Fantasia, PA-C  metoCLOPramide (REGLAN) 10 MG tablet Take 1 tablet (10 mg total) by mouth every 6 (six) hours as needed for  nausea. Patient not taking: Reported on 12/31/2022 03/19/21   Sharen Counter A, CNM  naproxen (NAPROSYN) 375 MG tablet Take 1 tablet twice daily as needed for pain. Patient not taking: Reported on 12/31/2022 08/24/21   Molpus, Jonny Ruiz, MD  ondansetron (ZOFRAN) 4 MG tablet Take 1 tablet (4 mg total) by mouth every 8 (eight) hours as needed for nausea or vomiting. Patient not taking: Reported on 12/31/2022 08/19/22   Bennie Pierini, FNP  promethazine-dextromethorphan (PROMETHAZINE-DM) 6.25-15 MG/5ML syrup Take 5 mLs by mouth 4 (four) times daily as needed for cough. Patient not taking: Reported on 12/31/2022 06/09/22   Ward, Tylene Fantasia, PA-C  scopolamine (TRANSDERM-SCOP) 1 MG/3DAYS Place 1 patch (1.5 mg total) onto the skin every 3 (three) days. Patient not taking: Reported on 12/31/2022 03/23/21   Donette Larry, CNM    Physical Exam: Vitals:   12/31/22 0300 12/31/22 0307 12/31/22 0400 12/31/22 0500  BP:  123/79 133/83 116/70  Pulse: 93 93 98 97  Resp: 20 20 16 14   Temp:      TempSrc:      SpO2: 97% 100% 93% 96%  Weight:      Height:        Physical Exam Vitals reviewed.  Constitutional:      General: She is not in acute distress. HENT:     Head: Normocephalic and atraumatic.  Eyes:     Extraocular Movements: Extraocular movements intact.  Cardiovascular:     Rate and Rhythm: Normal rate and regular rhythm.     Pulses: Normal pulses.  Pulmonary:     Effort: Pulmonary effort is normal. No respiratory distress.     Breath sounds: Normal breath sounds. No wheezing or rales.  Abdominal:     General: Bowel sounds are normal. There is no distension.     Palpations: Abdomen is soft.     Tenderness: There is no abdominal tenderness. There is no guarding.  Musculoskeletal:     Cervical back: Normal range of motion.     Right lower leg: No edema.     Left lower leg: No edema.  Skin:    General: Skin is warm and dry.  Neurological:     General: No focal deficit present.      Mental Status: She is alert and oriented to person, place, and time.     Labs on Admission: I have personally reviewed following labs and imaging studies  CBC: Recent Labs  Lab 12/30/22 2144  WBC 8.0  HGB 15.1*  HCT 43.4  MCV 83.8  PLT 347   Basic Metabolic Panel: Recent Labs  Lab 12/30/22 2144  NA 130*  K 2.3*  CL 84*  CO2 28  GLUCOSE 140*  BUN 32*  CREATININE 1.98*  CALCIUM 9.0   GFR: Estimated Creatinine Clearance: 51.4 mL/min (A) (by C-G formula based on SCr of 1.98 mg/dL (H)). Liver Function Tests: Recent Labs  Lab 12/30/22 2144  AST 57*  ALT 219*  ALKPHOS 61  BILITOT 0.8  PROT 7.8  ALBUMIN  4.0   Recent Labs  Lab 12/30/22 2144  LIPASE 39   No results for input(s): "AMMONIA" in the last 168 hours. Coagulation Profile: No results for input(s): "INR", "PROTIME" in the last 168 hours. Cardiac Enzymes: No results for input(s): "CKTOTAL", "CKMB", "CKMBINDEX", "TROPONINI" in the last 168 hours. BNP (last 3 results) No results for input(s): "PROBNP" in the last 8760 hours. HbA1C: No results for input(s): "HGBA1C" in the last 72 hours. CBG: No results for input(s): "GLUCAP" in the last 168 hours. Lipid Profile: No results for input(s): "CHOL", "HDL", "LDLCALC", "TRIG", "CHOLHDL", "LDLDIRECT" in the last 72 hours. Thyroid Function Tests: No results for input(s): "TSH", "T4TOTAL", "FREET4", "T3FREE", "THYROIDAB" in the last 72 hours. Anemia Panel: No results for input(s): "VITAMINB12", "FOLATE", "FERRITIN", "TIBC", "IRON", "RETICCTPCT" in the last 72 hours. Urine analysis:    Component Value Date/Time   COLORURINE YELLOW 12/31/2022 0058   APPEARANCEUR HAZY (A) 12/31/2022 0058   LABSPEC 1.029 12/31/2022 0058   PHURINE 5.0 12/31/2022 0058   GLUCOSEU NEGATIVE 12/31/2022 0058   HGBUR MODERATE (A) 12/31/2022 0058   BILIRUBINUR NEGATIVE 12/31/2022 0058   KETONESUR 5 (A) 12/31/2022 0058   PROTEINUR NEGATIVE 12/31/2022 0058   UROBILINOGEN 4.0 (H)  04/14/2014 1247   NITRITE NEGATIVE 12/31/2022 0058   LEUKOCYTESUR NEGATIVE 12/31/2022 0058    Radiological Exams on Admission: US PELVIC COMPLETE W TRANSVAGINAL AND TORSION R/O  Result Date: 12/31/2022 CLINICAL DATA:  Pelvic pain. EXAM: TRANSABDOMINAL AND TRANSVAGINAL ULTRASOUND OF PELVIS DOPPLER ULTRASOUND OF OVARIES TECHNIQUE: Both transabdominal and transvaginal ultrasound examinations of the pelvis were performed. Transabdominal technique was performed for global imaging of the pelvis including uterus, ovaries, adnexal regions, and pelvic cul-de-sac. It was necessary to proceed with endovaginal exam following the transabdominal exam to visualize the anatomy. Color and duplex Doppler ultrasound was utilized to evaluate blood flow to the ovaries. COMPARISON:  03/16/2021. FINDINGS: Uterus Measurements: 8.2 x 4.1 x 5.8 cm = volume: 102.2 mL. No fibroids or other mass visualized. Endometrium Thickness: 5 mm.  No focal abnormality visualized. Right ovary Measurements: 2.4 x 1.3 x 1.6 cm = volume: 2.7 mL. Normal appearance/no adnexal mass. Left ovary Measurements: 2.4 x 1.8 x 1.6 cm = volume: 3.7 mL. Normal appearance/no adnexal mass. Pulsed Doppler evaluation of both ovaries demonstrates normal low-resistance arterial and venous waveforms. Other findings No abnormal free fluid. IMPRESSION: No evidence of ovarian torsion or other acute process. Electronically Signed   By: Thornell Sartorius M.D.   On: 12/31/2022 03:20   CT ABDOMEN PELVIS W CONTRAST  Result Date: 12/30/2022 CLINICAL DATA:  Abdominal pain for several months, history of constipation. EXAM: CT ABDOMEN AND PELVIS WITH CONTRAST TECHNIQUE: Multidetector CT imaging of the abdomen and pelvis was performed using the standard protocol following bolus administration of intravenous contrast. RADIATION DOSE REDUCTION: This exam was performed according to the departmental dose-optimization program which includes automated exposure control, adjustment of the mA  and/or kV according to patient size and/or use of iterative reconstruction technique. CONTRAST:  OMNIPAQUE IOHEXOL 300 MG/ML  SOLN COMPARISON:  None Available. FINDINGS: Lower chest: No acute abnormality. Hepatobiliary: No focal liver abnormality is seen. Fatty infiltration of the liver is noted. No gallstones, gallbladder wall thickening, or biliary dilatation. Pancreas: Unremarkable. No pancreatic ductal dilatation or surrounding inflammatory changes. Spleen: Normal in size without focal abnormality. Adrenals/Urinary Tract: The adrenal glands are within normal limits. The kidneys enhance symmetrically. No renal calculus or hydronephrosis. The bladder is unremarkable. Stomach/Bowel: Stomach is within normal limits. Appendix appears  normal. No evidence of bowel wall thickening, distention, or inflammatory changes. No free air or pneumatosis. A mild amount of retained stool is present in the colon. Vascular/Lymphatic: No significant vascular findings are present. No enlarged abdominal or pelvic lymph nodes. Reproductive: Uterus and bilateral adnexa are unremarkable. Other: No abdominopelvic ascites. Musculoskeletal: Degenerative changes are present in the thoracolumbar spine. 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 No acute osseous abnormality. IMPRESSION: 1. No acute intra-abdominal process. 2. Hepatic steatosis. Electronically Signed   By: Thornell Sartorius M.D.   On: 12/30/2022 23:45    Assessment and Plan  Abdominal pain and constipation No fever or leukocytosis. AST 57, ALT 219, alk phos and T. bili normal, lipase normal.  UA not suggestive of infection. CT abdomen pelvis with contrast showing hepatic steatosis and no acute intra-abdominal process.  Showing a mild amount of retained stool in the colon.  Patient had a recent abortion 5 days ago but denies vaginal bleeding at present.  Pelvic ultrasound negative for acute finding.  She had nausea and vomiting during her pregnancy  but symptoms have subsided after the abortion.  Placed on bowel regimen for constipation: Daily MiraLAX, Senokot-S as needed.   Severe hypokalemia Likely due to vomiting and poor p.o. intake for several weeks in the setting of pregnancy.  Stat EKG ordered as it was not done in the ED.  Magnesium level pending.  Continue to replace potassium and monitor labs closely.   AKI Likely prerenal from dehydration. BUN 32, creatinine 1.9 (baseline 0.7-0.9).  CT without evidence of obstructive uropathy.  Continue IV fluid hydration and monitor renal function.  Avoid nephrotoxic agents.   Mild hyponatremia Likely due to vomiting/poor p.o. intake.  Continue IV fluid hydration with normal saline and monitor labs.   Hypertension Currently normotensive and not on any antihypertensives at home.   Prediabetes A1c 5.7 in March 2021, will repeat.  DVT prophylaxis: Lovenox Code Status: Full Code (discussed with the patient) Level of care: Telemetry bed Admission status: It is my clinical opinion that referral for OBSERVATION is reasonable and necessary in this patient based on the above information provided. The aforementioned taken together are felt to place the patient at high risk for further clinical deterioration. However, it is anticipated that the patient may be medically stable for discharge from the hospital within 24 to 48 hours.  John Giovanni MD [facility]  If 7PM-7AM, please contact night-coverage [email or web address]  12/31/2022, 6:02 AM

## 2023-01-01 DIAGNOSIS — K5900 Constipation, unspecified: Secondary | ICD-10-CM | POA: Diagnosis not present

## 2023-01-01 LAB — COMPREHENSIVE METABOLIC PANEL
ALT: 117 U/L — ABNORMAL HIGH (ref 0–44)
AST: 27 U/L (ref 15–41)
Albumin: 3.3 g/dL — ABNORMAL LOW (ref 3.5–5.0)
Alkaline Phosphatase: 45 U/L (ref 38–126)
Anion gap: 7 (ref 5–15)
BUN: 18 mg/dL (ref 6–20)
CO2: 30 mmol/L (ref 22–32)
Calcium: 8.6 mg/dL — ABNORMAL LOW (ref 8.9–10.3)
Chloride: 99 mmol/L (ref 98–111)
Creatinine, Ser: 1.25 mg/dL — ABNORMAL HIGH (ref 0.44–1.00)
GFR, Estimated: 58 mL/min — ABNORMAL LOW (ref >60)
Glucose, Bld: 109 mg/dL — ABNORMAL HIGH (ref 70–99)
Potassium: 4.1 mmol/L (ref 3.5–5.1)
Sodium: 136 mmol/L (ref 135–145)
Total Bilirubin: 0.6 mg/dL (ref 0.3–1.2)
Total Protein: 6 g/dL — ABNORMAL LOW (ref 6.5–8.1)

## 2023-01-01 LAB — HCG, QUANTITATIVE, PREGNANCY: hCG, Beta Chain, Quant, S: 954 m[IU]/mL — ABNORMAL HIGH (ref <5)

## 2023-01-01 LAB — MAGNESIUM: Magnesium: 2 mg/dL (ref 1.7–2.4)

## 2023-01-01 LAB — HEMOGLOBIN A1C
Hgb A1c MFr Bld: 6.3 % — ABNORMAL HIGH (ref 4.8–5.6)
Mean Plasma Glucose: 134 mg/dL

## 2023-01-01 MED ORDER — POLYETHYLENE GLYCOL 3350 17 G PO PACK: 17.0000 g | PACK | Freq: Every day | ORAL | 0 refills | Status: AC | PRN

## 2023-01-01 MED ORDER — SENNOSIDES-DOCUSATE SODIUM 8.6-50 MG PO TABS: 1.0000 | ORAL_TABLET | Freq: Every day | ORAL | Status: DC

## 2023-01-01 NOTE — Discharge Summary (Signed)
Physician Discharge Summary  Colleen Bradley WGN:[id number] DOB: [birth year] DOA: 12/30/2022  PCP: Marcine Matar, MD  Admit date: 12/30/2022 Discharge date: 01/01/2023  Admitted From: Home Discharge disposition: Home  Recommendations at discharge:  Avoid constipation.  Take as needed stool softeners and laxatives  Brief narrative: Colleen Bradley is a 32 y.o. female with PMH significant for PCOS, obesity, prediabetes, HTN, chronic constipation, GERD, former tobacco abuse 9/14, patient was brought to the ED by EMS from home with complaint of worsening abdominal pain that she had for several months. Patient reported she had no bowel movements for 2 months.  Also reported that 5 days prior to presentation, patient had a vacuum-assisted abortion at 10 weeks of gestation.  She did not have any complication or vaginal bleeding after that.    In the ED, patient afebrile, vital signs stable Labs with sodium low at 130, potassium low at 2.3, BUN/creatinine elevated to 32/1.98, AST/ALT elevated 57/219 Urinalysis with hazy yellow urine with moderate hemoglobin, rare bacteria CT abdomen pelvis without any acute intra-abdominal pathology.  Showed hepatic steatosis and mild stool retention. Beta-hCG level was elevated Pelvic ultrasound was unremarkable  Patient was started on IV fluid, electrolyte placement, and IV antibiotics Admitted to [facility]  Subjective: Patient was seen and examined this morning.  Young female.  Sitting up in bed.  Abdominal pain has improved.  Able to tolerate diet.  Wants to go home today. Chart reviewed.  Afebrile and hemodynamically stable. Labs this morning with sodium normal at 136, potassium normal at 4.1, creatinine improving to 1.25  Hospital course: Intractable abdominal pain Multifactorial etiology: Constipation, recent abortion Pain is gradually improved and feels better this morning, ready to go home.  Chronic constipation Patient reported no bowel movement for  2 months despite enemas at home.  However, CT abdomen only showed small amount of retained stool in the colon. Currently on bowel regimen with scheduled Senokot and MiraLAX. She states he had a big bowel movement this morning and feels much better.  Recent abortion Patient reported that 5 days prior to presentation, patient had a vacuum-assisted abortion at 10 weeks of gestation.  She did not have any complication or vaginal bleeding after that.  Beta-hCG level was elevated but is downtrending as expected Pelvic ultrasound without acute findings.  Severe hypokalemia Potassium level was significantly low at 2.3 in the ED.  Oral and IV replacement given with subsequent improvement. Recent Labs  Lab 12/30/22 2144 12/31/22 0645 12/31/22 1546 01/01/23 0556  K 2.3* 2.8* 3.3* 4.1  MG  --  2.3  --  2.0   AKI Creat was elevated to 1.98 on admission.  Improved gradually with hydration. Recent Labs    12/30/22 2144 12/31/22 0645 12/31/22 1546 01/01/23 0556  BUN 32* 25* 23* 18  CREATININE 1.98* 1.45* 1.59* 1.25*   Hyponatremia Improved with hydration. Recent Labs  Lab 12/30/22 2144 12/31/22 0645 12/31/22 1546 01/01/23 0556  NA 130* 131* 134* 136    Hepatic steatosis  Elevated liver enzymes LFTs trend as below showing improvement Abdominal imaging with hepatic steatosis. Recent Labs  Lab 12/30/22 2144 12/31/22 0645 12/31/22 1546 01/01/23 0556  AST 57* 47* 41 27  ALT 219* 187* 149* 117*  ALKPHOS 61 55 46 45  BILITOT 0.8 1.1 0.7 0.6  PROT 7.8 7.4 6.5 6.0*  ALBUMIN 4.0 3.8 3.4* 3.3*  LIPASE 39  --   --   --   PLT 347  --   --   --  Mobility: Independent at baseline  Goals of care   Code Status: Full Code   Wounds:  -    Discharge Exam:   Vitals:   12/31/22 1701 12/31/22 2222 01/01/23 0500 01/01/23 1304  BP: 134/88 114/79 123/68 130/66  Pulse: 92 95 83 92  Resp: 20 18 18    Temp: 98.9 F (37.2 C) 98.4 F (36.9 C) 98.1 F (36.7 C) 98.6 F (37 C)   TempSrc: Oral Oral Oral Oral  SpO2: 100% 100% 100% 100%  Weight:      Height:        Body mass index is 37.59 kg/m.  General exam: Pleasant, Skin: No rashes, lesions or ulcers. HEENT: Atraumatic, normocephalic, no obvious bleeding Lungs: CTAB CVS: S1, S2, M0 GI/Abd soft, distended from obesity, nontender, bowel sound present CNS: Alert,, awake, oriented x 3 Psychiatry: Mood appropriate Extremities: No pedal edema, no calf tenderness  Follow ups:    Follow-up Information     Marcine Matar, MD Follow up.   Specialty: Internal Medicine Contact information: [address] [employer] [address] [place] [address] [phone]                 Discharge Instructions:   Discharge Instructions     Call MD for:  difficulty breathing, headache or visual disturbances   Complete by: As directed    Call MD for:  extreme fatigue   Complete by: As directed    Call MD for:  hives   Complete by: As directed    Call MD for:  persistant dizziness or light-headedness   Complete by: As directed    Call MD for:  persistant nausea and vomiting   Complete by: As directed    Call MD for:  severe uncontrolled pain   Complete by: As directed    Call MD for:  temperature >100.4   Complete by: As directed    Diet general   Complete by: As directed    Discharge instructions   Complete by: As directed    Recommendations at discharge:   Avoid constipation.  Take as needed stool softeners and laxatives  General discharge instructions: Follow with Primary MD Marcine Matar, MD in 7 days  Please request your PCP  to go over your hospital tests, procedures, radiology results at the follow up. Please get your medicines reviewed and adjusted.  Your PCP may decide to repeat certain labs or tests as needed. Do not drive, operate heavy machinery, perform activities at heights, swimming or participation in water activities or provide baby sitting services if your were admitted for syncope  or siezures until you have seen by Primary MD or a Neurologist and advised to do so again. [place] Controlled Substance Reporting System database was reviewed. Do not drive, operate heavy machinery, perform activities at heights, swim, participate in water activities or provide baby-sitting services while on medications for pain, sleep and mood until your outpatient physician has reevaluated you and advised to do so again.  You are strongly recommended to comply with the dose, frequency and duration of prescribed medications. Activity: As tolerated with Full fall precautions use walker/cane & assistance as needed Avoid using any recreational substances like cigarette, tobacco, alcohol, or non-prescribed drug. If you experience worsening of your admission symptoms, develop shortness of breath, life threatening emergency, suicidal or homicidal thoughts you must seek medical attention immediately by calling 911 or calling your MD immediately  if symptoms less severe. You must read complete instructions/literature along with  all the possible adverse reactions/side effects for all the medicines you take and that have been prescribed to you. Take any new medicine only after you have completely understood and accepted all the possible adverse reactions/side effects.  Wear Seat belts while driving. You were cared for by a hospitalist during your hospital stay. If you have any questions about your discharge medications or the care you received while you were in the hospital after you are discharged, you can call the unit and ask to speak with the hospitalist or the covering physician. Once you are discharged, your primary care physician will handle any further medical issues. Please note that NO REFILLS for any discharge medications will be authorized once you are discharged, as it is imperative that you return to your primary care physician (or establish a relationship with a primary care physician if you do  not have one).   Increase activity slowly   Complete by: As directed        Discharge Medications:   Allergies as of 01/01/2023       Reactions   Penicillins Hives, Itching   Has patient had a PCN reaction causing immediate rash, facial/tongue/throat swelling, SOB or lightheadedness with hypotension: Yes Has patient had a PCN reaction causing severe rash involving mucus membranes or skin necrosis: No Has patient had a PCN reaction that required hospitalization Yes Has patient had a PCN reaction occurring within the last 10 years: No If all of the above answers are "NO", then may proceed with Cephalosporin use.        Medication List     TAKE these medications    albuterol 108 (90 Base) MCG/ACT inhaler Commonly known as: VENTOLIN HFA Inhale 2 puffs into the lungs every 6 (six) hours as needed for wheezing or shortness of breath.   famotidine 20 MG tablet Commonly known as: PEPCID Take 1 tablet (20 mg total) by mouth 2 (two) times daily.   fluticasone 50 MCG/ACT nasal spray Commonly known as: FLONASE Place 2 sprays into both nostrils daily.   metoCLOPramide 10 MG tablet Commonly known as: Reglan Take 1 tablet (10 mg total) by mouth every 6 (six) hours as needed for nausea.   naproxen 375 MG tablet Commonly known as: NAPROSYN Take 1 tablet twice daily as needed for pain.   ondansetron 4 MG tablet Commonly known as: Zofran Take 1 tablet (4 mg total) by mouth every 8 (eight) hours as needed for nausea or vomiting.   polyethylene glycol 17 g packet Commonly known as: MIRALAX / GLYCOLAX Take 17 g by mouth daily as needed for moderate constipation.   promethazine-dextromethorphan 6.25-15 MG/5ML syrup Commonly known as: PROMETHAZINE-DM Take 5 mLs by mouth 4 (four) times daily as needed for cough.   scopolamine 1 MG/3DAYS Commonly known as: TRANSDERM-SCOP Place 1 patch (1.5 mg total) onto the skin every 3 (three) days.         The results of significant  diagnostics from this hospitalization (including imaging, microbiology, ancillary and laboratory) are listed below for reference.    Procedures and Diagnostic Studies:   US PELVIC COMPLETE W TRANSVAGINAL AND TORSION R/O  Result Date: 12/31/2022 CLINICAL DATA:  Pelvic pain. EXAM: TRANSABDOMINAL AND TRANSVAGINAL ULTRASOUND OF PELVIS DOPPLER ULTRASOUND OF OVARIES TECHNIQUE: Both transabdominal and transvaginal ultrasound examinations of the pelvis were performed. Transabdominal technique was performed for global imaging of the pelvis including uterus, ovaries, adnexal regions, and pelvic cul-de-sac. It was necessary to proceed with endovaginal exam following the transabdominal exam to  visualize the anatomy. Color and duplex Doppler ultrasound was utilized to evaluate blood flow to the ovaries. COMPARISON:  03/16/2021. FINDINGS: Uterus Measurements: 8.2 x 4.1 x 5.8 cm = volume: 102.2 mL. No fibroids or other mass visualized. Endometrium Thickness: 5 mm.  No focal abnormality visualized. Right ovary Measurements: 2.4 x 1.3 x 1.6 cm = volume: 2.7 mL. Normal appearance/no adnexal mass. Left ovary Measurements: 2.4 x 1.8 x 1.6 cm = volume: 3.7 mL. Normal appearance/no adnexal mass. Pulsed Doppler evaluation of both ovaries demonstrates normal low-resistance arterial and venous waveforms. Other findings No abnormal free fluid. IMPRESSION: No evidence of ovarian torsion or other acute process. Electronically Signed   By: Thornell Sartorius M.D.   On: 12/31/2022 03:20   CT ABDOMEN PELVIS W CONTRAST  Result Date: 12/30/2022 CLINICAL DATA:  Abdominal pain for several months, history of constipation. EXAM: CT ABDOMEN AND PELVIS WITH CONTRAST TECHNIQUE: Multidetector CT imaging of the abdomen and pelvis was performed using the standard protocol following bolus administration of intravenous contrast. RADIATION DOSE REDUCTION: This exam was performed according to the departmental dose-optimization program which includes  automated exposure control, adjustment of the mA and/or kV according to patient size and/or use of iterative reconstruction technique. CONTRAST:  OMNIPAQUE IOHEXOL 300 MG/ML  SOLN COMPARISON:  None Available. FINDINGS: Lower chest: No acute abnormality. Hepatobiliary: No focal liver abnormality is seen. Fatty infiltration of the liver is noted. No gallstones, gallbladder wall thickening, or biliary dilatation. Pancreas: Unremarkable. No pancreatic ductal dilatation or surrounding inflammatory changes. Spleen: Normal in size without focal abnormality. Adrenals/Urinary Tract: The adrenal glands are within normal limits. The kidneys enhance symmetrically. No renal calculus or hydronephrosis. The bladder is unremarkable. Stomach/Bowel: Stomach is within normal limits. Appendix appears normal. No evidence of bowel wall thickening, distention, or inflammatory changes. No free air or pneumatosis. A mild amount of retained stool is present in the colon. Vascular/Lymphatic: No significant vascular findings are present. No enlarged abdominal or pelvic lymph nodes. Reproductive: Uterus and bilateral adnexa are unremarkable. Other: No abdominopelvic ascites. Musculoskeletal: Degenerative changes are present in the thoracolumbar spine. 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 No acute osseous abnormality. IMPRESSION: 1. No acute intra-abdominal process. 2. Hepatic steatosis. Electronically Signed   By: Thornell Sartorius M.D.   On: 12/30/2022 23:45     Labs:   Basic Metabolic Panel: Recent Labs  Lab 12/30/22 2144 12/31/22 0645 12/31/22 1546 01/01/23 0556  NA 130* 131* 134* 136  K 2.3* 2.8* 3.3* 4.1  CL 84* 87* 95* 99  CO2 28 30 28 30   GLUCOSE 140* 114* 113* 109*  BUN 32* 25* 23* 18  CREATININE 1.98* 1.45* 1.59* 1.25*  CALCIUM 9.0 9.3 8.7* 8.6*  MG  --  2.3  --  2.0   GFR Estimated Creatinine Clearance: 81.3 mL/min (A) (by C-G formula based on SCr of 1.25 mg/dL (H)). Liver  Function Tests: Recent Labs  Lab 12/30/22 2144 12/31/22 0645 12/31/22 1546 01/01/23 0556  AST 57* 47* 41 27  ALT 219* 187* 149* 117*  ALKPHOS 61 55 46 45  BILITOT 0.8 1.1 0.7 0.6  PROT 7.8 7.4 6.5 6.0*  ALBUMIN 4.0 3.8 3.4* 3.3*   Recent Labs  Lab 12/30/22 2144  LIPASE 39   No results for input(s): "AMMONIA" in the last 168 hours. Coagulation profile No results for input(s): "INR", "PROTIME" in the last 168 hours.  CBC: Recent Labs  Lab 12/30/22 2144  WBC 8.0  HGB 15.1*  HCT 43.4  MCV 83.8  PLT 347   Cardiac Enzymes: No results for input(s): "CKTOTAL", "CKMB", "CKMBINDEX", "TROPONINI" in the last 168 hours. BNP: Invalid input(s): "POCBNP" CBG: No results for input(s): "GLUCAP" in the last 168 hours. D-Dimer No results for input(s): "DDIMER" in the last 72 hours. Hgb A1c Recent Labs    12/31/22 0645  HGBA1C 6.3*   Lipid Profile No results for input(s): "CHOL", "HDL", "LDLCALC", "TRIG", "CHOLHDL", "LDLDIRECT" in the last 72 hours. Thyroid function studies No results for input(s): "TSH", "T4TOTAL", "T3FREE", "THYROIDAB" in the last 72 hours.  Invalid input(s): "FREET3" Anemia work up No results for input(s): "VITAMINB12", "FOLATE", "FERRITIN", "TIBC", "IRON", "RETICCTPCT" in the last 72 hours. Microbiology No results found for this or any previous visit (from the past 240 hour(s)).  Time coordinating discharge: 45 minutes  Signed: Jansel Vonstein  [facility] 01/01/2023, 2:06 PM

## 2023-01-02 ENCOUNTER — Telehealth: Payer: Self-pay

## 2023-01-02 NOTE — Transitions of Care (Post Inpatient/ED Visit) (Signed)
01/02/2023  Name: Colleen Bradley MRN: [id number] DOB: 06-21-1990  Today's TOC FU Call Status: Today's TOC FU Call Status:: Unsuccessful Call (1st Attempt) Unsuccessful Call (1st Attempt) Date: 01/02/23  Attempted to reach the patient regarding the most recent Inpatient/ED visit.  Follow Up Plan: Additional outreach attempts will be made to reach the patient to complete the Transitions of Care (Post Inpatient/ED visit) call.   Signature  Robyne Peers, RN

## 2023-01-03 ENCOUNTER — Telehealth: Payer: Self-pay

## 2023-01-03 NOTE — Transitions of Care (Post Inpatient/ED Visit) (Signed)
01/03/2023  Name: Colleen Bradley MRN: [id number] DOB: Dec 27, 1990  Today's TOC FU Call Status: Today's TOC FU Call Status:: Unsuccessful Call (2nd Attempt) Unsuccessful Call (1st Attempt) Date: 01/02/23 Unsuccessful Call (2nd Attempt) Date: 01/03/23  Attempted to reach the patient regarding the most recent Inpatient/ED visit.  Follow Up Plan: Additional outreach attempts will be made to reach the patient to complete the Transitions of Care (Post Inpatient/ED visit) call.   Signature  Robyne Peers, RN

## 2023-01-04 ENCOUNTER — Telehealth: Payer: Self-pay

## 2023-01-04 NOTE — Transitions of Care (Post Inpatient/ED Visit) (Signed)
01/04/2023  Name: Colleen Bradley MRN: [id number] DOB: 02-Dec-1990  Today's TOC FU Call Status: Today's TOC FU Call Status:: Unsuccessful Call (3rd Attempt) Unsuccessful Call (1st Attempt) Date: 01/02/23 Unsuccessful Call (2nd Attempt) Date: 01/03/23 Unsuccessful Call (3rd Attempt) Date: 01/04/23  Attempted to reach the patient regarding the most recent Inpatient/ED visit.  Follow Up Plan: No further outreach attempts will be made at this time. We have been unable to contact the patient.  Letter sent to patient requesting she contact [facility] to schedule a follow up appointment as we have not been able to reach her.   Signature  Robyne Peers, RN

## 2023-01-04 NOTE — Transitions of Care (Post Inpatient/ED Visit) (Signed)
01/04/2023  Name: Colleen Bradley MRN: [id number] DOB: 01/28/1991  Today's TOC FU Call Status: Today's TOC FU Call Status:: Successful TOC FU Call Completed Unsuccessful Call (1st Attempt) Date: 01/02/23 Unsuccessful Call (2nd Attempt) Date: 01/03/23 Unsuccessful Call (3rd Attempt) Date: 01/04/23 [facility] FU Call Complete Date: 01/04/23 Patient's Name and Date of Birth confirmed.  Transition Care Management Follow-up Telephone Call Date of Discharge: 01/01/23 Discharge Facility: [facility]) Type of Discharge: Inpatient Admission Primary Inpatient Discharge Diagnosis:: constipation How have you been since you were released from the hospital?: Better Any questions or concerns?: No  Items Reviewed: Did you receive and understand the discharge instructions provided?: Yes Medications obtained,verified, and reconciled?: Yes (Medications Reviewed) (She said she only has miralax, has not been taking anything else) Any new allergies since your discharge?: No Dietary orders reviewed?: Yes Type of Diet Ordered:: Heart Healthy, low sodium Do you have support at home?: Yes People in Home: significant other Name of Support/Comfort Primary Source: she receives support from her fiance and 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  Medications Reviewed Today: Medications Reviewed Today     Reviewed by Robyne Peers, RN (Case Manager) on 01/04/23 at 1036  Med List Status: <None>   Medication Order Taking? Sig Documenting Provider Last Dose Status Informant  albuterol (VENTOLIN HFA) 108 (90 Base) MCG/ACT inhaler [id number] No Inhale 2 puffs into the lungs every 6 (six) hours as needed for wheezing or shortness of breath.  Patient not taking: Reported on 12/31/2022   Ward, Tylene Fantasia, PA-C Not Taking Active Self  famotidine (PEPCID) 20 MG tablet [id number] No Take 1 tablet (20 mg total) by mouth 2 (two) times daily.  Patient not taking: Reported on 12/31/2022   Rolm Bookbinder, CNM Not  Taking Active Self  fluticasone [facility]) 50 MCG/ACT nasal spray [id number] No Place 2 sprays into both nostrils daily.  Patient not taking: Reported on 12/31/2022   Ward, Tylene Fantasia, PA-C Not Taking Active Self  metoCLOPramide (REGLAN) 10 MG tablet [id number] No Take 1 tablet (10 mg total) by mouth every 6 (six) hours as needed for nausea.  Patient not taking: Reported on 12/31/2022   Hurshel Party, CNM Not Taking Active Self  naproxen (NAPROSYN) 375 MG tablet [id number] No Take 1 tablet twice daily as needed for pain.  Patient not taking: Reported on 12/31/2022   Molpus, John, MD Not Taking Active Self  ondansetron (ZOFRAN) 4 MG tablet [id number] No Take 1 tablet (4 mg total) by mouth every 8 (eight) hours as needed for nausea or vomiting.  Patient not taking: Reported on 12/31/2022   Bennie Pierini, FNP Not Taking Active Self  polyethylene glycol (MIRALAX / GLYCOLAX) 17 g packet [id number]  Take 17 g by mouth daily as needed for moderate constipation. Lorin Glass, MD  Active   promethazine-dextromethorphan (PROMETHAZINE-DM) 6.25-15 MG/5ML syrup [id number] No Take 5 mLs by mouth 4 (four) times daily as needed for cough.  Patient not taking: Reported on 12/31/2022   Ward, Tylene Fantasia, PA-C Not Taking Active Self  scopolamine (TRANSDERM-SCOP) 1 MG/3DAYS [id number] No Place 1 patch (1.5 mg total) onto the skin every 3 (three) days.  Patient not taking: Reported on 12/31/2022   Donette Larry, CNM Not Taking Active Self            Home Care and Equipment/Supplies: Were Home Health Services Ordered?: No Any new equipment or medical supplies ordered?: No  Functional Questionnaire: Do you need assistance with bathing/showering or dressing?: No  Do you need assistance with meal preparation?: No Do you need assistance with eating?: No Do you have difficulty maintaining continence: No Do you need assistance with getting out of bed/getting out of a chair/moving?: No Do you have  difficulty managing or taking your medications?: No  Follow up appointments reviewed: PCP Follow-up appointment confirmed?: Yes Date of PCP follow-up appointment?: 01/30/23 Follow-up Provider: Dr Laural Benes.  I offered her the option of being seen sooner at another clinic but she declined, stating she wants to see Dr Laural Benes.   She will then need to re-establish care. Specialist Hospital Follow-up appointment confirmed?: NA Do you need transportation to your follow-up appointment?: No Do you understand care options if your condition(s) worsen?: Yes-patient verbalized understanding    SIGNATURE Robyne Peers, RN

## 2023-01-30 ENCOUNTER — Inpatient Hospital Stay: Payer: Medicaid Other | Admitting: Internal Medicine

## 2023-05-21 ENCOUNTER — Ambulatory Visit: Payer: Medicaid Other

## 2024-04-06 ENCOUNTER — Emergency Department (HOSPITAL_BASED_OUTPATIENT_CLINIC_OR_DEPARTMENT_OTHER): Admitting: Radiology

## 2024-04-06 ENCOUNTER — Encounter (HOSPITAL_BASED_OUTPATIENT_CLINIC_OR_DEPARTMENT_OTHER): Payer: Self-pay | Admitting: Emergency Medicine

## 2024-04-06 ENCOUNTER — Emergency Department (HOSPITAL_BASED_OUTPATIENT_CLINIC_OR_DEPARTMENT_OTHER)
Admission: EM | Admit: 2024-04-06 | Discharge: 2024-04-06 | Disposition: A | Discharge status: Home / Self Care | Attending: Emergency Medicine | Admitting: Emergency Medicine

## 2024-04-06 ENCOUNTER — Emergency Department (HOSPITAL_BASED_OUTPATIENT_CLINIC_OR_DEPARTMENT_OTHER)

## 2024-04-06 ENCOUNTER — Other Ambulatory Visit: Payer: Self-pay

## 2024-04-06 DIAGNOSIS — S0093XA Contusion of unspecified part of head, initial encounter: Secondary | ICD-10-CM | POA: Insufficient documentation

## 2024-04-06 DIAGNOSIS — S8002XA Contusion of left knee, initial encounter: Secondary | ICD-10-CM | POA: Diagnosis not present

## 2024-04-06 DIAGNOSIS — Y92410 Unspecified street and highway as the place of occurrence of the external cause: Secondary | ICD-10-CM | POA: Diagnosis not present

## 2024-04-06 DIAGNOSIS — S8992XA Unspecified injury of left lower leg, initial encounter: Secondary | ICD-10-CM | POA: Diagnosis present

## 2024-04-06 DIAGNOSIS — M25512 Pain in left shoulder: Secondary | ICD-10-CM | POA: Insufficient documentation

## 2024-04-06 DIAGNOSIS — M62838 Other muscle spasm: Secondary | ICD-10-CM | POA: Insufficient documentation

## 2024-04-06 MED ORDER — CYCLOBENZAPRINE HCL 10 MG PO TABS: 10.0000 mg | ORAL_TABLET | Freq: Two times a day (BID) | ORAL | 0 refills | Status: AC | PRN

## 2024-04-06 MED ORDER — ACETAMINOPHEN 500 MG PO TABS
1000.0000 mg | ORAL_TABLET | Freq: Once | ORAL | Status: AC
  Administered 2024-04-06: 1000 mg via ORAL
  Filled 2024-04-06: qty 2
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
## 2024-04-06 NOTE — ED Notes (Signed)
 Patient transported to CT

## 2024-04-06 NOTE — Discharge Instructions (Signed)
 Use Ace wrap for comfort.  Recommend 1000 mg of Tylenol  every 6 hours as needed for pain.  Recommend 400 mg ibuprofen  every 8 hours as needed for pain.  I have written you for muscle relaxant called Flexeril .  This medication is sedating so please be careful with its use.  Follow-up with your primary care doctor.  Recommend ice in the areas that hurt 20 minutes on 20 minutes off.
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
